# Patient Record
Sex: Female | Born: 1935 | Race: White | Hispanic: No | Marital: Married | Smoking: Former smoker
Health system: Southern US, Community
[De-identification: ages and names within clinical notes are randomized; demographics above are authoritative.]

## PROBLEM LIST (undated history)

## (undated) DIAGNOSIS — I495 Sick sinus syndrome: Secondary | ICD-10-CM

## (undated) DIAGNOSIS — I10 Essential (primary) hypertension: Secondary | ICD-10-CM

## (undated) DIAGNOSIS — I4891 Unspecified atrial fibrillation: Secondary | ICD-10-CM

## (undated) DIAGNOSIS — N289 Disorder of kidney and ureter, unspecified: Secondary | ICD-10-CM

## (undated) DIAGNOSIS — I2510 Atherosclerotic heart disease of native coronary artery without angina pectoris: Secondary | ICD-10-CM

## (undated) DIAGNOSIS — E785 Hyperlipidemia, unspecified: Secondary | ICD-10-CM

## (undated) DIAGNOSIS — I679 Cerebrovascular disease, unspecified: Secondary | ICD-10-CM

## (undated) HISTORY — DX: Essential (primary) hypertension: I10

## (undated) HISTORY — PX: CORONARY ARTERY BYPASS GRAFT: SHX141

## (undated) HISTORY — DX: Disorder of kidney and ureter, unspecified: N28.9

## (undated) HISTORY — PX: PACEMAKER INSERTION: SHX728

## (undated) HISTORY — DX: Atherosclerotic heart disease of native coronary artery without angina pectoris: I25.10

## (undated) HISTORY — DX: Hyperlipidemia, unspecified: E78.5

## (undated) HISTORY — DX: Cerebrovascular disease, unspecified: I67.9

## (undated) HISTORY — DX: Unspecified atrial fibrillation: I48.91

## (undated) HISTORY — DX: Sick sinus syndrome: I49.5

---

## 1997-09-25 ENCOUNTER — Emergency Department (HOSPITAL_COMMUNITY): Admission: EM | Admit: 1997-09-25 | Discharge: 1997-09-25 | Payer: Self-pay | Admitting: Internal Medicine

## 1999-04-18 ENCOUNTER — Encounter: Payer: Self-pay | Admitting: Emergency Medicine

## 1999-04-18 ENCOUNTER — Emergency Department (HOSPITAL_COMMUNITY): Admission: EM | Admit: 1999-04-18 | Discharge: 1999-04-18 | Payer: Self-pay | Admitting: Emergency Medicine

## 1999-04-21 ENCOUNTER — Inpatient Hospital Stay (HOSPITAL_COMMUNITY): Admission: EM | Admit: 1999-04-21 | Discharge: 1999-04-23 | Payer: Self-pay | Admitting: Emergency Medicine

## 2001-11-30 ENCOUNTER — Encounter: Admission: RE | Admit: 2001-11-30 | Discharge: 2001-11-30 | Payer: Self-pay | Admitting: Endocrinology

## 2001-11-30 ENCOUNTER — Encounter: Payer: Self-pay | Admitting: Endocrinology

## 2006-06-08 ENCOUNTER — Ambulatory Visit: Payer: Self-pay | Admitting: Cardiology

## 2006-11-07 ENCOUNTER — Ambulatory Visit: Payer: Self-pay | Admitting: Cardiology

## 2006-11-07 LAB — CONVERTED CEMR LAB
ALT: 15 units/L (ref 0–35)
AST: 29 units/L (ref 0–37)
Albumin: 3.1 g/dL — ABNORMAL LOW (ref 3.5–5.2)
Alkaline Phosphatase: 62 units/L (ref 39–117)
BUN: 46 mg/dL — ABNORMAL HIGH (ref 6–23)
Bilirubin, Direct: 0.1 mg/dL (ref 0.0–0.3)
CO2: 26 meq/L (ref 19–32)
Calcium: 9 mg/dL (ref 8.4–10.5)
Chloride: 110 meq/L (ref 96–112)
Cholesterol: 149 mg/dL (ref 0–200)
Creatinine, Ser: 1.5 mg/dL — ABNORMAL HIGH (ref 0.4–1.2)
GFR calc Af Amer: 43 mL/min
GFR calc non Af Amer: 35 mL/min
Glucose, Bld: 117 mg/dL — ABNORMAL HIGH (ref 70–99)
HDL: 46.9 mg/dL (ref >39.0)
LDL Cholesterol: 87 mg/dL (ref 0–99)
Potassium: 4.1 meq/L (ref 3.5–5.1)
Pro B Natriuretic peptide (BNP): 96 pg/mL (ref 0.0–100.0)
Sodium: 144 meq/L (ref 135–145)
Total Bilirubin: 0.6 mg/dL (ref 0.3–1.2)
Total CHOL/HDL Ratio: 3.2
Total Protein: 6.5 g/dL (ref 6.0–8.3)
Triglycerides: 74 mg/dL (ref 0–149)
VLDL: 15 mg/dL (ref 0–40)

## 2006-11-24 ENCOUNTER — Ambulatory Visit: Payer: Self-pay

## 2006-12-01 ENCOUNTER — Ambulatory Visit: Payer: Self-pay | Admitting: Cardiology

## 2007-10-18 ENCOUNTER — Ambulatory Visit: Payer: Self-pay | Admitting: Cardiology

## 2007-11-10 ENCOUNTER — Ambulatory Visit: Payer: Self-pay

## 2007-11-22 ENCOUNTER — Ambulatory Visit: Payer: Self-pay

## 2008-07-31 ENCOUNTER — Encounter (INDEPENDENT_AMBULATORY_CARE_PROVIDER_SITE_OTHER): Payer: Self-pay | Admitting: *Deleted

## 2008-10-08 DIAGNOSIS — E785 Hyperlipidemia, unspecified: Secondary | ICD-10-CM

## 2008-10-08 DIAGNOSIS — I10 Essential (primary) hypertension: Secondary | ICD-10-CM | POA: Insufficient documentation

## 2008-10-08 DIAGNOSIS — R0602 Shortness of breath: Secondary | ICD-10-CM

## 2008-10-08 DIAGNOSIS — N259 Disorder resulting from impaired renal tubular function, unspecified: Secondary | ICD-10-CM | POA: Insufficient documentation

## 2008-10-08 DIAGNOSIS — I679 Cerebrovascular disease, unspecified: Secondary | ICD-10-CM

## 2008-10-08 DIAGNOSIS — I2510 Atherosclerotic heart disease of native coronary artery without angina pectoris: Secondary | ICD-10-CM | POA: Insufficient documentation

## 2008-10-09 ENCOUNTER — Encounter: Payer: Self-pay | Admitting: Cardiology

## 2008-10-09 ENCOUNTER — Ambulatory Visit: Payer: Self-pay | Admitting: Cardiology

## 2008-10-09 ENCOUNTER — Encounter (INDEPENDENT_AMBULATORY_CARE_PROVIDER_SITE_OTHER): Payer: Self-pay | Admitting: *Deleted

## 2008-12-18 ENCOUNTER — Ambulatory Visit: Payer: Self-pay

## 2008-12-18 ENCOUNTER — Encounter: Payer: Self-pay | Admitting: Cardiology

## 2009-06-11 ENCOUNTER — Encounter: Payer: Self-pay | Admitting: Cardiology

## 2009-06-13 ENCOUNTER — Encounter: Payer: Self-pay | Admitting: Cardiology

## 2009-06-13 ENCOUNTER — Emergency Department (HOSPITAL_COMMUNITY): Admission: EM | Admit: 2009-06-13 | Discharge: 2009-06-13 | Payer: Self-pay | Admitting: Emergency Medicine

## 2009-06-16 ENCOUNTER — Ambulatory Visit: Payer: Self-pay | Admitting: Cardiology

## 2009-06-16 DIAGNOSIS — I4891 Unspecified atrial fibrillation: Secondary | ICD-10-CM

## 2009-06-16 DIAGNOSIS — I4892 Unspecified atrial flutter: Secondary | ICD-10-CM

## 2009-06-17 ENCOUNTER — Telehealth (INDEPENDENT_AMBULATORY_CARE_PROVIDER_SITE_OTHER): Payer: Self-pay

## 2009-06-18 ENCOUNTER — Encounter: Payer: Self-pay | Admitting: Cardiology

## 2009-06-18 ENCOUNTER — Ambulatory Visit: Payer: Self-pay

## 2009-06-18 ENCOUNTER — Ambulatory Visit (HOSPITAL_COMMUNITY): Admission: RE | Admit: 2009-06-18 | Discharge: 2009-06-18 | Payer: Self-pay | Admitting: Cardiology

## 2009-06-18 ENCOUNTER — Ambulatory Visit: Payer: Self-pay | Admitting: Cardiology

## 2009-06-19 ENCOUNTER — Ambulatory Visit: Payer: Self-pay | Admitting: Cardiology

## 2009-06-20 ENCOUNTER — Telehealth: Payer: Self-pay | Admitting: Cardiology

## 2009-06-21 ENCOUNTER — Telehealth: Payer: Self-pay | Admitting: Nurse Practitioner

## 2009-06-23 ENCOUNTER — Inpatient Hospital Stay (HOSPITAL_COMMUNITY): Admission: AD | Admit: 2009-06-23 | Discharge: 2009-06-25 | Payer: Self-pay | Admitting: Cardiology

## 2009-06-23 ENCOUNTER — Encounter: Payer: Self-pay | Admitting: Cardiology

## 2009-06-23 ENCOUNTER — Ambulatory Visit: Payer: Self-pay | Admitting: Cardiology

## 2009-06-24 ENCOUNTER — Encounter: Payer: Self-pay | Admitting: Internal Medicine

## 2009-06-27 ENCOUNTER — Encounter: Payer: Self-pay | Admitting: Internal Medicine

## 2009-07-01 ENCOUNTER — Encounter: Payer: Self-pay | Admitting: Cardiology

## 2009-07-02 ENCOUNTER — Ambulatory Visit: Payer: Self-pay | Admitting: Cardiology

## 2009-07-02 ENCOUNTER — Encounter: Payer: Self-pay | Admitting: Cardiology

## 2009-07-02 DIAGNOSIS — Z950 Presence of cardiac pacemaker: Secondary | ICD-10-CM

## 2009-07-09 ENCOUNTER — Encounter: Payer: Self-pay | Admitting: Internal Medicine

## 2009-07-09 ENCOUNTER — Ambulatory Visit: Payer: Self-pay

## 2009-07-09 ENCOUNTER — Ambulatory Visit: Payer: Self-pay | Admitting: Cardiology

## 2009-07-09 LAB — CONVERTED CEMR LAB: POC INR: 3.9

## 2009-07-16 ENCOUNTER — Ambulatory Visit: Payer: Self-pay | Admitting: Cardiology

## 2009-07-16 LAB — CONVERTED CEMR LAB: POC INR: 4

## 2009-07-21 ENCOUNTER — Telehealth: Payer: Self-pay | Admitting: Cardiology

## 2009-07-23 ENCOUNTER — Ambulatory Visit: Payer: Self-pay | Admitting: Internal Medicine

## 2009-07-30 ENCOUNTER — Ambulatory Visit: Payer: Self-pay

## 2009-07-30 LAB — CONVERTED CEMR LAB: POC INR: 2.2

## 2009-08-13 ENCOUNTER — Ambulatory Visit: Payer: Self-pay | Admitting: Internal Medicine

## 2009-08-13 LAB — CONVERTED CEMR LAB: POC INR: 2.2

## 2009-09-10 ENCOUNTER — Ambulatory Visit: Payer: Self-pay | Admitting: Cardiology

## 2009-09-24 ENCOUNTER — Ambulatory Visit: Payer: Self-pay | Admitting: Cardiovascular Disease

## 2009-09-24 ENCOUNTER — Ambulatory Visit: Payer: Self-pay | Admitting: Internal Medicine

## 2009-09-24 LAB — CONVERTED CEMR LAB: POC INR: 2.1

## 2009-10-21 ENCOUNTER — Ambulatory Visit: Payer: Self-pay | Admitting: Cardiology

## 2009-10-21 ENCOUNTER — Ambulatory Visit: Payer: Self-pay | Admitting: Cardiovascular Disease

## 2009-10-21 LAB — CONVERTED CEMR LAB: POC INR: 1.9

## 2009-11-08 ENCOUNTER — Encounter: Payer: Self-pay | Admitting: Cardiology

## 2009-11-18 ENCOUNTER — Ambulatory Visit: Payer: Self-pay | Admitting: Cardiology

## 2010-01-07 ENCOUNTER — Telehealth (INDEPENDENT_AMBULATORY_CARE_PROVIDER_SITE_OTHER): Payer: Self-pay | Admitting: *Deleted

## 2010-01-07 ENCOUNTER — Ambulatory Visit: Payer: Self-pay | Admitting: Internal Medicine

## 2010-01-07 ENCOUNTER — Ambulatory Visit: Payer: Self-pay

## 2010-01-07 ENCOUNTER — Encounter: Payer: Self-pay | Admitting: Cardiology

## 2010-02-04 ENCOUNTER — Ambulatory Visit: Payer: Self-pay | Admitting: Cardiology

## 2010-02-04 LAB — CONVERTED CEMR LAB: POC INR: 2.6

## 2010-02-05 ENCOUNTER — Ambulatory Visit: Admit: 2010-02-05 | Payer: Self-pay

## 2010-03-09 ENCOUNTER — Ambulatory Visit
Admission: RE | Admit: 2010-03-09 | Discharge: 2010-03-09 | Payer: Self-pay | Source: Home / Self Care | Attending: Internal Medicine | Admitting: Internal Medicine

## 2010-03-09 ENCOUNTER — Ambulatory Visit: Admission: RE | Admit: 2010-03-09 | Discharge: 2010-03-09 | Payer: Self-pay | Source: Home / Self Care

## 2010-03-09 LAB — CONVERTED CEMR LAB: POC INR: 2.2

## 2010-03-11 DIAGNOSIS — Z7901 Long term (current) use of anticoagulants: Secondary | ICD-10-CM | POA: Insufficient documentation

## 2010-03-11 DIAGNOSIS — I4891 Unspecified atrial fibrillation: Secondary | ICD-10-CM

## 2010-03-11 DIAGNOSIS — I679 Cerebrovascular disease, unspecified: Secondary | ICD-10-CM

## 2010-03-11 DIAGNOSIS — I4892 Unspecified atrial flutter: Secondary | ICD-10-CM

## 2010-03-12 NOTE — Medication Information (Signed)
Summary: rov/sp  Anticoagulant Therapy  Managed by: Bethena Midget, RN, BSN Referring MD: Olga Millers PCP: Dr. Areta Haber MD: Johney Frame MD, Fayrene Fearing Indication 1: Atrial Fibrillation Lab Used: [facility] [place] Site: [address] INR POC 2.2 INR RANGE 2-3  Dietary changes: no    Health status changes: no    Bleeding/hemorrhagic complications: no    Recent/future hospitalizations: no    Any changes in medication regimen? yes       Details: Took Atonel on 08/08/09  Recent/future dental: no  Any missed doses?: no       Is patient compliant with meds? yes       Allergies: 1)  ! Penicillin 2)  ! Sulfa 3)  ! Steroids 4)  ! Codeine 5)  ! Cephalosporins 6)  ! Paxil 7)  ! Tetracycline 8)  ! Atracurium 9)  ! Macrobid  Anticoagulation Management History:      The patient is taking warfarin and comes in today for a routine follow up visit.  Positive risk factors for bleeding include an age of 75 years or older.  The bleeding index is 'intermediate risk'.  Positive CHADS2 values include History of HTN and Age > 35 years old.  Anticoagulation responsible provider: Aragorn Recker MD, Fayrene Fearing.  INR POC: 2.2.  Exp: 09/2010.    Anticoagulation Management Assessment/Plan:      The patient's current anticoagulation dose is Warfarin sodium 2.5 mg tabs: Use as directed by Anticoagualtion Clinic, Warfarin sodium 1 mg tabs: Use as directed by Anticoagualtion Clinic, Coumadin 1 mg tabs: Take as directed by coumadin clinic..  The target INR is 2.0-3.0.  The next INR is due 09/10/2009.  Anticoagulation instructions were given to patient/daughter.  Results were reviewed/authorized by Bethena Midget, RN, BSN.  She was notified by Bethena Midget, RN, BSN.         Prior Anticoagulation Instructions: INR 2.2  Continue same dose of 1 tablet every day except 2 tablets on Monday and Friday.   Current Anticoagulation Instructions: INR 2.2 Continue 1mg s everyday except 2mg s on Mondays and  Fridays. Recheck in 4 weeks.  Prescriptions: COUMADIN 1 MG TABS (WARFARIN SODIUM) Take as directed by coumadin clinic.  #40 x 3   Entered by:   Bethena Midget, RN, BSN   Authorized by:   Hillis Range, MD   Signed by:   Bethena Midget, RN, BSN on 08/13/2009   Method used:   Print then Give to Patient   RxID:   [id number] WARFARIN SODIUM 1 MG TABS (WARFARIN SODIUM) Use as directed by Anticoagualtion Clinic  #40 x 3   Entered by:   Bethena Midget, RN, BSN   Authorized by:   Hillis Range, MD   Signed by:   Bethena Midget, RN, BSN on 08/13/2009   Method used:   Print then Give to Patient   RxID:   [id number] COUMADIN 1 MG TABS (WARFARIN SODIUM) Take as directed by coumadin clinic.  #40 x 3   Entered by:   Bethena Midget, RN, BSN   Authorized by:   Ferman Hamming, MD, [facility]   Signed by:   Bethena Midget, RN, BSN on 08/13/2009   Method used:   Print then Give to Patient   RxID:   [id number] WARFARIN SODIUM 1 MG TABS (WARFARIN SODIUM) Use as directed by Anticoagualtion Clinic  #40 x 3   Entered by:   Bethena Midget, RN, BSN   Authorized by:   Ferman Hamming, MD, [facility]   Signed  by:   Bethena Midget, RN, BSN on 08/13/2009   Method used:   Print then Give to Patient   RxID:   [id number]

## 2010-03-12 NOTE — Medication Information (Signed)
Summary: rov/cb  Anticoagulant Therapy  Managed by: Elaina Pattee, PharmD Referring MD: Olga Millers PCP: Dr. Areta Haber MD: Juanda Chance MD, Tyri Elmore Indication 1: Atrial Fibrillation [place] Site: [address] INR POC 4.0 INR RANGE 2-3  Dietary changes: no    Health status changes: no    Bleeding/hemorrhagic complications: no          Allergies: 1)  ! Penicillin 2)  ! Sulfa 3)  ! Steroids 4)  ! Codeine 5)  ! Cephalosporins 6)  ! Paxil 7)  ! Tetracycline 8)  ! Atracurium 9)  ! Macrobid  Anticoagulation Management History:      The patient is taking warfarin and comes in today for a routine follow up visit.  Positive risk factors for bleeding include an age of 35 years or older.  The bleeding index is 'intermediate risk'.  Positive CHADS2 values include History of HTN and Age > 32 years old.  Anticoagulation responsible provider: Juanda Chance MD, Smitty Cords.  INR POC: 4.0.  Cuvette Lot#: [id number].  Exp: 09/2010.    Anticoagulation Management Assessment/Plan:      The patient's current anticoagulation dose is Warfarin sodium 2.5 mg tabs: Use as directed by Anticoagualtion Clinic.  The next INR is due 07/23/2009.  Results were reviewed/authorized by Elaina Pattee, PharmD.  She was notified by Elaina Pattee, PharmD.         Prior Anticoagulation Instructions: INR 3.9. Hold Coumadin today and tomorrow, then take 0.5 tablet daily except 1 tablet on Mon and Fri.  Recheck in 1 week.  Current Anticoagulation Instructions: INR 4.0. Hold Coumadin today and tomorrow, then start taking 1 mg daily except 2 mg on Fridays.  Recheck in 1 week.

## 2010-03-12 NOTE — Progress Notes (Signed)
Summary: Records Request  Faxed OV, EKG & Discharge Summary to [facility] ([phone]). Debby Freiberg  January 07, 2010 12:37 PM

## 2010-03-12 NOTE — Progress Notes (Signed)
Summary: Cardiology Phone Note - A.Fib with post-termination pause  Phone Note Other Incoming   Caller: [facility]/ then outgoing to patient Summary of Call: received a call from [facility] stating that pt. had a run of a. fib followed by a 5.2 sec pause and restoration of sinus rhythm.  i called the patient @ home and she denied feeling any Ss this am.  She asked that I explain things to her dtr who then got on the phone.  I confirmed for her that Dr. Jens Som was suspicious that pt. may be having post-termination pauses, accounting for presyncope, and in fact, she is (though this appears to have been asymptomatic).  I initially advised, after discussion with Dr. Myrtis Ser, that pt. come into ED for admission with probable plan for pacer placement early next week, however pt says she feels fine and isn't even sure that she wants a pacemaker.  That said, I advised that if pt. has any recurrence of either tachypalps or presyncope that they should come into the ER for eval and admission, as tachypalps are likely to beget additional pauses when the arrhythmia breaks.  Dtr understands instructions and plans to stay with pt today.  I told her that the office would be in touch on Monday morning, likely to arrange pacer placement if pt was agreeable @ that point.  Pt. and dtr were grateful for callback. Initial call taken by: Creig Hines, ANP-BC,  Jun 21, 2009 10:47 AM     Appended Document: Cardiology Phone Note - A.Fib with post-termination pause Needs EP eval for pacemaker ASAP  Appended Document: Cardiology Phone Note - A.Fib with post-termination pause pt will dr allred this afternoon

## 2010-03-12 NOTE — Miscellaneous (Signed)
Summary: Device preload  Clinical Lists Changes  Observations: Added new observation of PPM INDICATN: Sick sinus syndrome (06/27/2009 15:54) Added new observation of MAGNET RTE: BOL 85 ERI 65 (06/27/2009 15:54) Added new observation of PPMLEADSTAT1: active (06/27/2009 15:54) Added new observation of PPMLEADSER1: [id number] (06/27/2009 15:54) Added new observation of PPMLEADMOD1: 5092  (06/27/2009 15:54) Added new observation of PPMLEADDOI1: 06/24/2009  (06/27/2009 15:54) Added new observation of PPMLEADLOC1: RV  (06/27/2009 15:54) Added new observation of PPM IMP MD: Hillis Range, MD  (06/27/2009 15:54) Added new observation of PPM DOI: 06/24/2009  (06/27/2009 15:54) Added new observation of PPM SERL#: [id number]  (06/27/2009 15:54) Added new observation of PPM MODL#: [id number]  (06/27/2009 78:29) Added new observation of PACEMAKERMFG: [employer]  (06/27/2009 15:54) Added new observation of PACEMAKER MD: Hillis Range, MD  (06/27/2009 15:54)      PPM Specifications Following MD:  Hillis Range, MD     PPM Vendor:  [employer]     PPM Model Number:  [id number]     PPM Serial Number:  [id number] PPM DOI:  06/24/2009     PPM Implanting MD:  Hillis Range, MD  Lead 1    Location: RV     DOI: 06/24/2009     Model #: [id number]     Serial #: [id number]     Status: active  Magnet Response Rate:  BOL 85 ERI 65  Indications:  Sick sinus syndrome

## 2010-03-12 NOTE — Medication Information (Signed)
Summary: CCV/NEW PT/DM  Anticoagulant Therapy  Managed by: Elaina Pattee, PharmD Referring MD: Olga Millers PCP: Dr. Areta Haber MD: Riley Kill MD, Maisie Fus Indication 1: Atrial Fibrillation [place] Site: [address] INR POC 3.9 INR RANGE 2-3  Dietary changes: yes       Details: Educated on eating consistent amounts of vitamin K-containing foods.  Health status changes: yes       Details: Educated on notifying of health changes.  Bleeding/hemorrhagic complications: yes       Details: Educated on signs/symptoms of bleeding and management.  Recent/future hospitalizations: yes       Details: Educated on notifying of procedures/hospitalizations.  Any changes in medication regimen? yes       Details: Educated on notifying of medication changes.  Recent/future dental: yes     Details: Educated on management regarding procedures.  Any missed doses?: yes     Details: Educated on importance of not doubling doses or missing doses.  Is patient compliant with meds? no     Details: Educated on compliance.  Comments: Performed comprehensive medication review.  Discussed indication and INR range.  Current Medications (verified): 1)  Dyazide 37.5-25 Mg Caps (Triamterene-Hctz) .... Take 1 Capsule By Mouth Once A Day 2)  Procardia Xl 30 Mg Xr24h-Tab (Nifedipine) .... Take 1 Tablet By Mouth Once A Day 3)  Lipitor 80 Mg Tabs (Atorvastatin Calcium) .... Take One Tablet By Mouth Daily. 4)  Actonel 35 Mg Tabs (Risedronate Sodium) .... Monthly 5)  Colace 100 Mg Caps (Docusate Sodium) .Marland Kitchen.. 1 Tab By Mouth Two Times A Day 6)  Levothroid 25 Mcg Tabs (Levothyroxine Sodium) .Marland Kitchen.. 1 Tab By Mouth Once Daily 7)  Multivitamins   Tabs (Multiple Vitamin) .Marland Kitchen.. 1 Tab By Mouth Once Daily 8)  Vit D2 .Marland Kitchen.. 1 Tab By Mouth On Fridays (1.25mg ) 9)  Metoprolol Succinate 25 Mg Xr24h-Tab (Metoprolol Succinate) .... Take One Half  Tablet By Mouth Daily 10)  Icaps  Caps (Multiple Vitamins-Minerals) .... Take 1 Capsule By  Mouth Two Times A Day 11)  Vitamin C 500 Mg  Tabs (Ascorbic Acid) .... Once A Day 12)  Warfarin Sodium 2.5 Mg Tabs (Warfarin Sodium) .... Use As Directed By Anticoagualtion Clinic 13)  Ferrous Sulfate 325 (65 Fe) Mg Tabs (Ferrous Sulfate) .Marland Kitchen.. 1 Tablet By Mouth Daily  Allergies: 1)  ! Penicillin 2)  ! Sulfa 3)  ! Steroids 4)  ! Codeine 5)  ! Cephalosporins 6)  ! Paxil 7)  ! Tetracycline 8)  ! Atracurium 9)  ! Macrobid  Anticoagulation Management History:      The patient comes in today for her initial visit for anticoagulation therapy.  Positive risk factors for bleeding include an age of 75 years or older.  The bleeding index is 'intermediate risk'.  Positive CHADS2 values include History of HTN and Age > 52 years old.  Anticoagulation responsible provider: Riley Kill MD, Maisie Fus.  INR POC: 3.9.  Cuvette Lot#: [id number].  Exp: 09/2010.    Anticoagulation Management Assessment/Plan:      The patient's current anticoagulation dose is Warfarin sodium 2.5 mg tabs: Use as directed by Anticoagualtion Clinic.  The next INR is due 07/16/2009.  Results were reviewed/authorized by Elaina Pattee, PharmD.  She was notified by Elaina Pattee, PharmD.         Current Anticoagulation Instructions: INR 3.9. Hold Coumadin today and tomorrow, then take 0.5 tablet daily except 1 tablet on Mon and Fri.  Recheck in 1 week.

## 2010-03-12 NOTE — Assessment & Plan Note (Signed)
Summary: eph/ c/o afib, seen in ed on friday/ gd   Primary Provider:  Dr. Roxy Manns   History of Present Illness: [name] is a pleasant female who I have seen in the past for coronary artery disease status post coronary artery bypass and graft. She also has cerebrovascular disease.  Her last Myoview on November 10, 2007 showed prior distal anterior infarct and  periinfarct ischemia.  The ejection fraction was 83%. It was unchanged compared to previous. We have treated medically as she has had no symptoms. Last carotid Dopplers were performed in Nov 2010  showed a 40-59% bilateral stenosis  and followup recommended in one year. I last saw her in Nov 2010. Since then she was seen in the emergency room 3 days ago with an episode of atrial fibrillation/flutter converted spontaneously to sinus rhythm. Note the patient has had occasional palpitations for the past month. They're not associated with dyspnea or chest pain. She also has had "dizzy spells". They occur suddenly and resolved within one to 2 seconds. They're not associated with activities or position. They resolve spontaneously. She otherwise denies any dyspnea on exertion or orthopnea but she has had minimal pedal edema. She does not have exertional chest pain.   Current Medications (verified): 1)  Dyazide 37.5-25 Mg Caps (Triamterene-Hctz) .... Take 1 Capsule By Mouth Once A Day 2)  Procardia Xl 30 Mg Xr24h-Tab (Nifedipine) .... Take 1 Tablet By Mouth Once A Day 3)  Lipitor 80 Mg Tabs (Atorvastatin Calcium) .... Take One Tablet By Mouth Daily. 4)  Aspirin Ec 325 Mg Tbec (Aspirin) .... Take One Tablet By Mouth Daily 5)  Persantine 50 Mg Tabs (Dipyridamole) .... Take 1 Tablet By Mouth Three Times A Day 6)  Actonel 35 Mg Tabs (Risedronate Sodium) .... Monthly 7)  Colace 100 Mg Caps (Docusate Sodium) .Marland Kitchen.. 1 Tab By Mouth Two Times A Day 8)  Levothroid 25 Mcg Tabs (Levothyroxine Sodium) .Marland Kitchen.. 1 Tab By Mouth Once Daily 9)  Multivitamins   Tabs  (Multiple Vitamin) .Marland Kitchen.. 1 Tab By Mouth Once Daily 10)  Vit D2 .Marland Kitchen.. 1 Tab By Mouth Once Daily  Allergies: 1)  ! Penicillin 2)  ! Sulfa 3)  ! Steroids 4)  ! Codeine 5)  ! Cephalosporins 6)  ! Paxil 7)  ! Tenormin  Past History:  Past Medical History: CAD (ICD-414.00) HYPERLIPIDEMIA (ICD-272.4) HYPERTENSION (ICD-401.9) CEREBROVASCULAR DISEASE (ICD-437.9) Atrial Flutter/atrial fibrillation RENAL INSUFFICIENCY (ICD-588.9)  Past Surgical History: Reviewed history from 10/09/2008 and no changes required.  status post coronary artery bypass and graft. saphenous vein graft to the diagonal/LAD, saphenous vein graft to circumflex and saphenous vein graft to the right coronary artery.  Social History: Reviewed history from 10/08/2008 and no changes required.  She has a remote history of tobacco use but does not  smoke at present.  She does not consume alcohol.  Review of Systems       Significant unsteadiness with gait but no fevers or chills, productive cough, hemoptysis, dysphasia, odynophagia, melena, hematochezia, dysuria, hematuria, rash, seizure activity, orthopnea, PND,  claudication. Remaining systems are negative.   Vital Signs:  Patient profile:   77 year old female Height:      65 inches Weight:      112 pounds BMI:     18.71 Pulse rate:   65 / minute Resp:     14 per minute BP sitting:   130 / 58  (left arm)  Vitals Entered By: Kem Parkinson (Jun 16, 2009 2:43 PM)  Physical Exam  General:  Well-developed frail in no acute distress.  Skin is warm and dry.  HEENT is normal.  Neck is supple. No thyromegaly. bilateral bruits Chest is clear to auscultation with normal expansion.  Cardiovascular exam is regular rate and rhythm.  Abdominal exam nontender or distended. No masses palpated. Extremities show no edema. neuro grossly intact    EKG  Procedure date:  06/16/2009  Findings:      Sinus rhythm at a rate of 65. Biatrial enlargement. No significant ST  changes.  Impression & Recommendations:  Problem # 1:  ATRIAL FIBRILLATION (ICD-427.31) Patient has both documented atrial fibrillation and flutter. I will schedule an echocardiogram to quantify LV function. Recent TSH was mildly elevated. The patient is having brief dizzy spells. They last one to 2 seconds and I am concerned about the possibility of post termination pauses. I will schedule a CardioNet monitor to more fully evaluate. I am hesitant to add AV nodal blocking agents until we further evaluate this issue. I also had long discussions with the patient and her daughter concerning the risk of embolic events. She has multiple risk factors including age greater than 23, hypertension, diabetes and prior CVA. However she is extremely unsteady on her feet. She is also having near syncopal episodes as described above. For now I will treat with enteric coated aspirin 325 mg p.o. daily. We will await CardioNet monitor results. If she indeed is having post termination pauses then a pacemaker would correct this and we could add Coumadin later. They both understand the higher risk of an embolic event on aspirin. Her updated medication list for this problem includes:    Aspirin Ec 325 Mg Tbec (Aspirin) .Marland Kitchen... Take one tablet by mouth daily    Persantine 50 Mg Tabs (Dipyridamole) .Marland Kitchen... Take 1 tablet by mouth three times a day  Problem # 2:  ATRIAL FLUTTER (ICD-427.32) The patient also has an electrocardiogram demonstrating atrial flutter. I do not think this is worth ablating as she has atrial fibrillation as well. Plan as outlined above. Her updated medication list for this problem includes:    Aspirin Ec 325 Mg Tbec (Aspirin) .Marland Kitchen... Take one tablet by mouth daily    Persantine 50 Mg Tabs (Dipyridamole) .Marland Kitchen... Take 1 tablet by mouth three times a day  Orders: Event (Event)  Problem # 3:  CAD (ICD-414.00) Continue aspirin and statin. Scheduled myoview. Her updated medication list for this problem  includes:    Procardia Xl 30 Mg Xr24h-tab (Nifedipine) .Marland Kitchen... Take 1 tablet by mouth once a day    Aspirin Ec 325 Mg Tbec (Aspirin) .Marland Kitchen... Take one tablet by mouth daily    Persantine 50 Mg Tabs (Dipyridamole) .Marland Kitchen... Take 1 tablet by mouth three times a day  Orders: Nuclear Stress Test (Nuc Stress Test)  Problem # 4:  HYPERLIPIDEMIA (ICD-272.4) Continue statin. Her updated medication list for this problem includes:    Lipitor 80 Mg Tabs (Atorvastatin calcium) .Marland Kitchen... Take one tablet by mouth daily.  Problem # 5:  HYPERTENSION (ICD-401.9) Blood pressure controlled on present medications. Will continue. Her updated medication list for this problem includes:    Dyazide 37.5-25 Mg Caps (Triamterene-hctz) .Marland Kitchen... Take 1 capsule by mouth once a day    Procardia Xl 30 Mg Xr24h-tab (Nifedipine) .Marland Kitchen... Take 1 tablet by mouth once a day    Aspirin Ec 325 Mg Tbec (Aspirin) .Marland Kitchen... Take one tablet by mouth daily  Problem # 6:  CEREBROVASCULAR DISEASE (ICD-437.9) Continue aspirin and statin.  Follow carotid Dopplers November 2011.  Problem # 7:  RENAL INSUFFICIENCY (ICD-588.9) Renal function monitored by primary care.  Other Orders: Echocardiogram (Echo)  Patient Instructions: 1)  Your physician recommends that you schedule a follow-up appointment in: 6 WEEKS 2)  Your physician has requested that you have an echocardiogram.  Echocardiography is a painless test that uses sound waves to create images of your heart. It provides your doctor with information about the size and shape of your heart and how well your heart's chambers and valves are working.  This procedure takes approximately one hour. There are no restrictions for this procedure. 3)  Your physician has recommended that you wear an event monitor.  Event monitors are medical devices that record the heart's electrical activity. Doctors most often use these monitors to diagnose arrhythmias. Arrhythmias are problems with the speed or rhythm of the  heartbeat. The monitor is a small, portable device. You can wear one while you do your normal daily activities. This is usually used to diagnose what is causing palpitations/syncope (passing out). 4)  Your physician has requested that you have an exercise stress myoview.  For further information please visit [email or web address]  Please follow instruction sheet, as given.

## 2010-03-12 NOTE — Cardiovascular Report (Signed)
Summary: Office Visit   Office Visit   Imported By: Roderic Ovens 07/25/2009 11:56:13  _____________________________________________________________________  External Attachment:    Type:   Image     Comment:   External Document

## 2010-03-12 NOTE — Procedures (Signed)
Summary: Summary Report  Summary Report   Imported By: Erle Crocker 07/11/2009 16:33:16  _____________________________________________________________________  External Attachment:    Type:   Image     Comment:   External Document

## 2010-03-12 NOTE — Medication Information (Signed)
Summary: rov/cb  Anticoagulant Therapy  Managed by: Weston Brass, PharmD Referring MD: Olga Millers PCP: Dr. Areta Haber MD: Gala Romney MD, Reuel Boom Indication 1: Atrial Fibrillation [place] Site: [address] INR POC 2.0 INR RANGE 2-3  Dietary changes: no    Health status changes: yes       Details: felt sick over weekend.  Got overheated.  See phone note from earlier this week  Bleeding/hemorrhagic complications: no    Recent/future hospitalizations: no    Any changes in medication regimen? no    Recent/future dental: no  Any missed doses?: no       Is patient compliant with meds? yes       Allergies: 1)  ! Penicillin 2)  ! Sulfa 3)  ! Steroids 4)  ! Codeine 5)  ! Cephalosporins 6)  ! Paxil 7)  ! Tetracycline 8)  ! Atracurium 9)  ! Macrobid  Anticoagulation Management History:      The patient is taking warfarin and comes in today for a routine follow up visit.  Positive risk factors for bleeding include an age of 6 years or older.  The bleeding index is 'intermediate risk'.  Positive CHADS2 values include History of HTN and Age > 46 years old.  Anticoagulation responsible provider: Bensimhon MD, Reuel Boom.  INR POC: 2.0.  Cuvette Lot#: [id number].  Exp: 09/2010.    Anticoagulation Management Assessment/Plan:      The patient's current anticoagulation dose is Warfarin sodium 2.5 mg tabs: Use as directed by Anticoagualtion Clinic.  The target INR is 2.0-3.0.  The next INR is due 07/30/2009.  Anticoagulation instructions were given to patient/daughter.  Results were reviewed/authorized by Weston Brass, PharmD.  She was notified by Weston Brass PharmD.         Prior Anticoagulation Instructions: INR 4.0. Hold Coumadin today and tomorrow, then start taking 1 mg daily except 2 mg on Fridays.  Recheck in 1 week.  Current Anticoagulation Instructions: INR 2.0  Increase dose to 1mg  daily except 2mg  on Monday and Friday.

## 2010-03-12 NOTE — Medication Information (Signed)
Summary: rov/sp  Anticoagulant Therapy  Managed by: Weston Brass, PharmD Referring MD: Olga Millers PCP: Dr. Areta Haber MD: Eden Emms MD, Theron Arista Indication 1: Atrial Fibrillation [place] Site: [address] INR POC 2.2 INR RANGE 2-3  Dietary changes: no    Health status changes: no    Bleeding/hemorrhagic complications: no    Recent/future hospitalizations: no    Any changes in medication regimen? no    Recent/future dental: no  Any missed doses?: no       Is patient compliant with meds? yes       Allergies: 1)  ! Penicillin 2)  ! Sulfa 3)  ! Steroids 4)  ! Codeine 5)  ! Cephalosporins 6)  ! Paxil 7)  ! Tetracycline 8)  ! Atracurium 9)  ! Macrobid  Anticoagulation Management History:      The patient is taking warfarin and comes in today for a routine follow up visit.  Positive risk factors for bleeding include an age of 75 years or older.  The bleeding index is 'intermediate risk'.  Positive CHADS2 values include History of HTN and Age > 34 years old.  Anticoagulation responsible provider: Eden Emms MD, Theron Arista.  INR POC: 2.2.  Exp: 09/2010.    Anticoagulation Management Assessment/Plan:      The patient's current anticoagulation dose is Warfarin sodium 2.5 mg tabs: Use as directed by Anticoagualtion Clinic.  The target INR is 2.0-3.0.  The next INR is due 08/13/2009.  Anticoagulation instructions were given to patient/daughter.  Results were reviewed/authorized by Weston Brass, PharmD.  She was notified by Weston Brass PharmD.         Prior Anticoagulation Instructions: INR 2.0  Increase dose to 1mg  daily except 2mg  on Monday and Friday.   Current Anticoagulation Instructions: INR 2.2  Continue same dose of 1 tablet every day except 2 tablets on Monday and Friday.

## 2010-03-12 NOTE — Medication Information (Signed)
Summary: rov/sp  Anticoagulant Therapy  Managed by: Reina Fuse, PharmD Referring MD: Olga Millers PCP: Dr. Areta Haber MD: Gala Romney MD, Reuel Boom Indication 1: Atrial Fibrillation Lab Used: [facility] [place] Site: [address] INR POC 1.9 INR RANGE 2-3  Dietary changes: no    Health status changes: no    Bleeding/hemorrhagic complications: no    Recent/future hospitalizations: no    Any changes in medication regimen? no    Recent/future dental: no  Any missed doses?: no       Is patient compliant with meds? yes       Allergies: 1)  ! Penicillin 2)  ! Sulfa 3)  ! Steroids 4)  ! Codeine 5)  ! Cephalosporins 6)  ! Paxil 7)  ! Tetracycline 8)  ! Atracurium 9)  ! Macrobid  Anticoagulation Management History:      The patient is taking warfarin and comes in today for a routine follow up visit.  Positive risk factors for bleeding include an age of 75 years or older.  The bleeding index is 'intermediate risk'.  Positive CHADS2 values include History of HTN and Age > 21 years old.  Anticoagulation responsible Solstice Lastinger: Bensimhon MD, Reuel Boom.  INR POC: 1.9.  Cuvette Lot#: [id number].  Exp: 12/2010.    Anticoagulation Management Assessment/Plan:      The patient's current anticoagulation dose is Warfarin sodium 1 mg tabs: Use as directed by Anticoagualtion Clinic.  The target INR is 2.0-3.0.  The next INR is due 02/04/2010.  Anticoagulation instructions were given to patient/daughter.  Results were reviewed/authorized by Reina Fuse, PharmD.  She was notified by Reina Fuse PharmD.         Prior Anticoagulation Instructions: INR 2.3  Continue same dose of 1 tablet every day except 2 tablets on Monday and Friday.  Recheck INR in 4 weeks.   Current Anticoagulation Instructions: INR 1.9  Today, take Coumadin 1.5 tabs (1.5 mg).  Then, continue taking Coumadin 1 tab (1 mg) on all days except for Coumadin 2 tabs (2 mg) on Mon, Fri. Return to clinic in 4  weeks.

## 2010-03-12 NOTE — Medication Information (Signed)
Summary: coumadin f/u appt. with crenshaw at 12:30/sl  Anticoagulant Therapy  Managed by: Weston Brass, PharmD Referring MD: Olga Millers PCP: Dr. Areta Haber MD: Clifton James MD, Cristal Deer Indication 1: Atrial Fibrillation Lab Used: [facility] [place] Site: [address] INR POC 1.9 INR RANGE 2-3  Dietary changes: no    Health status changes: no    Bleeding/hemorrhagic complications: no    Recent/future hospitalizations: no    Any changes in medication regimen? no    Recent/future dental: no  Any missed doses?: no       Is patient compliant with meds? yes       Allergies: 1)  ! Penicillin 2)  ! Sulfa 3)  ! Steroids 4)  ! Codeine 5)  ! Cephalosporins 6)  ! Paxil 7)  ! Tetracycline 8)  ! Atracurium 9)  ! Macrobid  Anticoagulation Management History:      The patient is taking warfarin and comes in today for a routine follow up visit.  Positive risk factors for bleeding include an age of 51 years or older.  The bleeding index is 'intermediate risk'.  Positive CHADS2 values include History of HTN and Age > 36 years old.  Anticoagulation responsible Lamara Brecht: Clifton James MD, Cristal Deer.  INR POC: 1.9.  Exp: 11/2010.    Anticoagulation Management Assessment/Plan:      The patient's current anticoagulation dose is Warfarin sodium 1 mg tabs: Use as directed by Anticoagualtion Clinic.  The target INR is 2.0-3.0.  The next INR is due 11/18/2009.  Anticoagulation instructions were given to patient/daughter.  Results were reviewed/authorized by Weston Brass, PharmD.  She was notified by Weston Brass PharmD.         Prior Anticoagulation Instructions: INR 2.1 Continue 1mg s daily except 2mg s on Mondays and Fridays. Recheck in 4 weeks.   Current Anticoagulation Instructions: INR 1.9  Take 2 tablets today then resume same dose of 1 tablet every day except 2 tablets on Monday and Friday.  Recheck INR in 4 weeks.

## 2010-03-12 NOTE — Progress Notes (Signed)
Summary: over heated  Phone Note Call from Patient Call back at Home Phone [phone] Call back at [phone]   Caller: Daughter/Cindy Summary of Call: got over heated Saturday while riding in the car..... headache started Saturday evening, got sick Sunday am, was able to take meds and ate dinner, did eat this am... this am when she was getting off the toilet she almost passed out, request to speak to nurse asap Initial call taken by: Migdalia Dk,  July 21, 2009 11:43 AM  Follow-up for Phone Call        spoke with dtr cindy, pt got overheated on saturday. she developed a headache and did not feel well. on sunday she vomited, tylenol relieved the headache. today she feels weak, her bp yesterday was 104/59 and when she went to stand she got dizzy. pt instructed to increase fluid intake for the next 24 hours. they will monitor her symptoms and if not feeling better in the am they will call back. Deliah Goody, RN  July 21, 2009 12:27 PM\par

## 2010-03-12 NOTE — Cardiovascular Report (Signed)
Summary: Implant Record  Implant Record   Imported By: Marylou Mccoy 08/26/2009 18:40:10  _____________________________________________________________________  External Attachment:    Type:   Image     Comment:   External Document

## 2010-03-12 NOTE — Progress Notes (Signed)
Summary: Nuc. Pre-Procedure  Phone Note Outgoing Call Call back at [facility] Phone [phone]   Call placed by: Irean Hong, RN,  Jun 17, 2009 11:34 AM Summary of Call: Reviewed information on Myoview Information Sheet (see scanned document for further details).  Spoke with patient and patient's daughter. The patient on persantine three times a day. The patient to hold the persantine 72 hrs prior to Lexiscan(discussed with Dr. Ludwig Clarks nurse), and continue ASA. Preston Weill,RN.

## 2010-03-12 NOTE — Progress Notes (Signed)
Summary: Patients At Home Vitals   Patients At Home Vitals   Imported By: Roderic Ovens 12/24/2009 12:31:33  _____________________________________________________________________  External Attachment:    Type:   Image     Comment:   External Document

## 2010-03-12 NOTE — Medication Information (Signed)
Summary: rov/sp  Anticoagulant Therapy  Managed by: Weston Brass, PharmD Referring MD: Olga Millers PCP: Dr. Areta Haber MD: Juanda Chance MD, Bruce Indication 1: Atrial Fibrillation Lab Used: [facility]  Site: [address] INR POC 2.3 INR RANGE 2-3  Dietary changes: no    Health status changes: no    Bleeding/hemorrhagic complications: no    Recent/future hospitalizations: no    Any changes in medication regimen? no    Recent/future dental: no  Any missed doses?: yes     Details: missed 1 dose last week   Is patient compliant with meds? yes       Allergies: 1)  ! Penicillin 2)  ! Sulfa 3)  ! Steroids 4)  ! Codeine 5)  ! Cephalosporins 6)  ! Paxil 7)  ! Tetracycline 8)  ! Atracurium 9)  ! Macrobid  Anticoagulation Management History:      The patient is taking warfarin and comes in today for a routine follow up visit.  Positive risk factors for bleeding include an age of 75 years or older.  The bleeding index is 'intermediate risk'.  Positive CHADS2 values include History of HTN and Age > 28 years old.  Anticoagulation responsible provider: Juanda Chance MD, Smitty Cords.  INR POC: 2.3.  Cuvette Lot#: [id number].  Exp: 12/2010.    Anticoagulation Management Assessment/Plan:      The patient's current anticoagulation dose is Warfarin sodium 1 mg tabs: Use as directed by Anticoagualtion Clinic.  The target INR is 2.0-3.0.  The next INR is due 12/16/2009.  Anticoagulation instructions were given to patient/daughter.  Results were reviewed/authorized by Weston Brass, PharmD.  She was notified by Weston Brass PharmD.         Prior Anticoagulation Instructions: INR 1.9  Take 2 tablets today then resume same dose of 1 tablet every day except 2 tablets on Monday and Friday.  Recheck INR in 4 weeks.   Current Anticoagulation Instructions: INR 2.3  Continue same dose of 1 tablet every day except 2 tablets on Monday and Friday.  Recheck INR in 4 weeks.

## 2010-03-12 NOTE — Progress Notes (Signed)
Summary: daugther out of town/call other relatives  Phone Note Call from Patient   Caller: Daughter/Sue Anne Hahn Summary of Call: daughter is going out of town, concerned about her mother.... request if anything happens to call Ames Dura @ [phone] or Gerre Scull [phone], they will be the closes relatives Initial call taken by: Migdalia Dk,  Jun 20, 2009 9:34 AM  Follow-up for Phone Call        spoke with cindy,she is aware of test results Deliah Goody, RN  Jun 20, 2009 4:23 PM

## 2010-03-12 NOTE — Assessment & Plan Note (Signed)
Summary: F3M/DM [place] office coumadin appt. at 12/sl   Primary [place]:  Dr. Roxy Manns   History of Present Illness: Jill Houston is a pleasant female who I have seen in the past for coronary artery disease status post coronary artery bypass and graft. She also has cerebrovascular disease.  Her last Myoview on November 10, 2007 showed prior distal anterior infarct and  periinfarct ischemia.  The ejection fraction was 83%. It was unchanged compared to previous. We have treated medically as she has had no symptoms. Last carotid Dopplers were performed in Nov 2010  showed a 40-59% bilateral stenosis  and followup recommended in one year. She has also been found to have paroxysmal atrial fibrillation/flutter. Echocardiogram in May 2011 revealed normal LV function, mild aortic, mitral and tricuspid regurgitation.  I last saw her in May 2011. She was having dizzy spells and a monitor was placed which revealed significant pauses. She therefore had a pacemaker placed. Note there was a single chamber pacemaker. When she was ventricular paced her blood pressure decreased. When I saw her previously in May 2011 she was bradycardic and we decreased her Toprol. Since then the patient denies any dyspnea on exertion, orthopnea, PND, pedal edema, palpitations, syncope or chest pain.   Current Medications (verified): 1)  Dyazide 37.5-25 Mg Caps (Triamterene-Hctz) .... Take 1 Capsule By Mouth Once A Day 2)  Procardia Xl 30 Mg Xr24h-Tab (Nifedipine) .... Take 1 Tablet By Mouth Once A Day 3)  Lipitor 80 Mg Tabs (Atorvastatin Calcium) .... Take One Tablet By Mouth Daily. 4)  Actonel 35 Mg Tabs (Risedronate Sodium) .... Monthly 5)  Colace 100 Mg Caps (Docusate Sodium) .Marland Kitchen.. 1 Tab By Mouth Two Times A Day 6)  Levothroid 25 Mcg Tabs (Levothyroxine Sodium) .Marland Kitchen.. 1 Tab By Mouth Once Daily 7)  Multivitamins   Tabs (Multiple Vitamin) .Marland Kitchen.. 1 Tab By Mouth Once Daily 8)  Vit D2 .Marland Kitchen.. 1 Tab By Mouth On Fridays (1.25mg ) 9)  Icaps   Caps (Multiple Vitamins-Minerals) .... Take 1 Capsule By Mouth Two Times A Day 10)  Vitamin C 500 Mg  Tabs (Ascorbic Acid) .... Once A Day 11)  Warfarin Sodium 1 Mg Tabs (Warfarin Sodium) .... Use As Directed By Anticoagualtion Clinic 12)  Ferrous Sulfate 325 (65 Fe) Mg Tabs (Ferrous Sulfate) .Marland Kitchen.. 1 Tablet By Mouth Daily  Allergies: 1)  ! Penicillin 2)  ! Sulfa 3)  ! Steroids 4)  ! Codeine 5)  ! Cephalosporins 6)  ! Paxil 7)  ! Tetracycline 8)  ! Atracurium 9)  ! Macrobid  Past History:  Past Medical History: Reviewed history from 07/02/2009 and no changes required. CAD (ICD-414.00) HYPERLIPIDEMIA (ICD-272.4) HYPERTENSION (ICD-401.9) CEREBROVASCULAR DISEASE (ICD-437.9) Atrial Flutter/atrial fibrillation RENAL INSUFFICIENCY (ICD-588.9) H/O post termination pauses requiring pacemaker  Past Surgical History: Reviewed history from 10/09/2008 and no changes required.  status post coronary artery bypass and graft. saphenous vein graft to the diagonal/LAD, saphenous vein graft to circumflex and saphenous vein graft to the right coronary artery.  Social History: Reviewed history from 10/08/2008 and no changes required.  She has a remote history of tobacco use but does not  smoke at present.  She does not consume alcohol.  Review of Systems       no fevers or chills, productive cough, hemoptysis, dysphasia, odynophagia, melena, hematochezia, dysuria, hematuria, rash, seizure activity, orthopnea, PND, pedal edema, claudication. Remaining systems are negative.   Vital Signs:  Patient profile:   77 year old female Height:  65 inches Weight:      115 pounds BMI:     19.21 Pulse rate:   67 / minute Resp:     18 per minute BP sitting:   160 / 75  (left arm)  Vitals Entered By: Kem Parkinson (October 21, 2009 12:07 PM)  Physical Exam  General:  Well-developed well-nourished in no acute distress.  Skin is warm and dry.  HEENT is normal.  Neck is supple. No  thyromegaly.  Chest is clear to auscultation with normal expansion.  Cardiovascular exam is regular rate and rhythm.  Abdominal exam nontender or distended. No masses palpated. Extremities show no edema. neuro grossly intact    EKG  Procedure date:  10/21/2009  Findings:      Sus rhythm at a rate of 67. Nonspecific ST changes. Occasional PACs.  PPM Specifications Following MD:  Hillis Range, MD     PPM Vendor:  [employer]     PPM Model Number:  [id number]     PPM Serial Number:  [id number] PPM DOI:  06/24/2009     PPM Implanting MD:  Hillis Range, MD  Lead 1    Location: [facility]     DOI: 06/24/2009     Model #: [id number]     Serial #: [id number]     Status: active  Magnet Response Rate:  BOL 85 ERI 65  Indications:  Sick sinus syndrome   PPM Follow Up Pacer Dependent:  No      Parameters Mode:  VVI     Lower Rate Limit:  40     Impression & Recommendations:  Problem # 1:  ATRIAL FIBRILLATION (ICD-427.31) Continue Coumadin. She is not on any AV nodal blocking agent as this caused significant bradycardia previously. She does have a pacemaker but it is a single chamber device. When she past ventricular pacing for prolonged periods she developed hypotension. Her updated medication list for this problem includes:    Warfarin Sodium 1 Mg Tabs (Warfarin sodium) ..... Use as directed by anticoagualtion clinic  Problem # 2:  PACEMAKER, PERMANENT (ICD-V45.01) Management per electrophysiology.  Problem # 3:  ATRIAL FLUTTER (ICD-427.32)  Her updated medication list for this problem includes:    Warfarin Sodium 1 Mg Tabs (Warfarin sodium) ..... Use as directed by anticoagualtion clinic  Problem # 4:  CAD (ICD-414.00) Continue present medications. Plan repeat Myoview in 6 months when she returns. Her updated medication list for this problem includes:    Procardia Xl 30 Mg Xr24h-tab (Nifedipine) .Marland Kitchen... Take 1 tablet by mouth once a day    Warfarin Sodium 1 Mg Tabs (Warfarin sodium) ..... Use as  directed by anticoagualtion clinic  Problem # 5:  HYPERLIPIDEMIA (ICD-272.4) Continue statin. Lipids and liver monitored by primary care. Her updated medication list for this problem includes:    Lipitor 80 Mg Tabs (Atorvastatin calcium) .Marland Kitchen... Take one tablet by mouth daily.  Problem # 6:  HYPERTENSION (ICD-401.9) Blood pressure mildly elevated. However it typically is normal. Following at home and increase meds as needed. Her updated medication list for this problem includes:    Dyazide 37.5-25 Mg Caps (Triamterene-hctz) .Marland Kitchen... Take 1 capsule by mouth once a day    Procardia Xl 30 Mg Xr24h-tab (Nifedipine) .Marland Kitchen... Take 1 tablet by mouth once a day  Problem # 7:  CEREBROVASCULAR DISEASE (ICD-437.9) Continue statin. All of carotid Dopplers November 2011. She is not on aspirin because she is on Coumadin and felt to be high her bleeding risk. Orders: Carotid Duplex (  Carotid Duplex)  Problem # 8:  RENAL INSUFFICIENCY (ICD-588.9)  Patient Instructions: 1)  Your physician recommends that you schedule a follow-up appointment in: 6 MONTHS 2)  Your physician has requested that you have a carotid duplex. This test is an ultrasound of the carotid arteries in your neck. It looks at blood flow through these arteries that supply the brain with blood. Allow one hour for this exam. There are no restrictions or special instructions.

## 2010-03-12 NOTE — Medication Information (Signed)
Summary: rov/tm  Anticoagulant Therapy  Managed by: Weston Brass, PharmD Referring MD: Olga Millers PCP: Dr. Areta Haber MD: Riley Kill MD, Maisie Fus Indication 1: Atrial Fibrillation Lab Used: [facility]  Site: [address] INR POC 2.4 INR RANGE 2-3  Dietary changes: no    Health status changes: no    Bleeding/hemorrhagic complications: no    Recent/future hospitalizations: no    Any changes in medication regimen? no    Recent/future dental: no  Any missed doses?: no       Is patient compliant with meds? yes       Allergies: 1)  ! Penicillin 2)  ! Sulfa 3)  ! Steroids 4)  ! Codeine 5)  ! Cephalosporins 6)  ! Paxil 7)  ! Tetracycline 8)  ! Atracurium 9)  ! Macrobid  Anticoagulation Management History:      The patient is taking warfarin and comes in today for a routine follow up visit.  Positive risk factors for bleeding include an age of 75 years or older.  The bleeding index is 'intermediate risk'.  Positive CHADS2 values include History of HTN and Age > 74 years old.  Anticoagulation responsible provider: Riley Kill MD, Maisie Fus.  INR POC: 2.4.  Exp: 09/2010.    Anticoagulation Management Assessment/Plan:      The patient's current anticoagulation dose is Warfarin sodium 2.5 mg tabs: Use as directed by Anticoagualtion Clinic, Warfarin sodium 1 mg tabs: Use as directed by Anticoagualtion Clinic, Coumadin 1 mg tabs: Take as directed by coumadin clinic..  The target INR is 2.0-3.0.  The next INR is due 10/08/2009.  Anticoagulation instructions were given to patient/daughter.  Results were reviewed/authorized by Weston Brass, PharmD.  She was notified by Liana Gerold, PharmD Candidate.         Prior Anticoagulation Instructions: INR 2.2 Continue 1mg s everyday except 2mg s on Mondays and Fridays. Recheck in 4 weeks.   Current Anticoagulation Instructions: INR 2.4  Continue with Coumadin 1 tablet (1 mg) daily except 2 tablets (2 mg) Mon and Fri.   Return to clinic in 4 weeks.

## 2010-03-12 NOTE — Procedures (Signed)
Summary: wound check   Current Medications (verified): 1)  Dyazide 37.5-25 Mg Caps (Triamterene-Hctz) .... Take 1 Capsule By Mouth Once A Day 2)  Procardia Xl 30 Mg Xr24h-Tab (Nifedipine) .... Take 1 Tablet By Mouth Once A Day 3)  Lipitor 80 Mg Tabs (Atorvastatin Calcium) .... Take One Tablet By Mouth Daily. 4)  Actonel 35 Mg Tabs (Risedronate Sodium) .... Monthly 5)  Colace 100 Mg Caps (Docusate Sodium) .Marland Kitchen.. 1 Tab By Mouth Two Times A Day 6)  Levothroid 25 Mcg Tabs (Levothyroxine Sodium) .Marland Kitchen.. 1 Tab By Mouth Once Daily 7)  Multivitamins   Tabs (Multiple Vitamin) .Marland Kitchen.. 1 Tab By Mouth Once Daily 8)  Vit D2 .Marland Kitchen.. 1 Tab By Mouth On Fridays (1.25mg ) 9)  Icaps  Caps (Multiple Vitamins-Minerals) .... Take 1 Capsule By Mouth Two Times A Day 10)  Vitamin C 500 Mg  Tabs (Ascorbic Acid) .... Once A Day 11)  Warfarin Sodium 2.5 Mg Tabs (Warfarin Sodium) .... Use As Directed By Anticoagualtion Clinic 12)  Ferrous Sulfate 325 (65 Fe) Mg Tabs (Ferrous Sulfate) .Marland Kitchen.. 1 Tablet By Mouth Daily  Allergies (verified): 1)  ! Penicillin 2)  ! Sulfa 3)  ! Steroids 4)  ! Codeine 5)  ! Cephalosporins 6)  ! Paxil 7)  ! Tetracycline 8)  ! Atracurium 9)  ! Macrobid  PPM Specifications Following MD:  Hillis Range, MD     PPM Vendor:  [employer]     PPM Model Number:  [id number]     PPM Serial Number:  [id number] PPM DOI:  06/24/2009     PPM Implanting MD:  Hillis Range, MD  Lead 1    Location: [facility]     DOI: 06/24/2009     Model #: [id number]     Serial #: [id number]     Status: active  Magnet Response Rate:  BOL 85 ERI 65  Indications:  Sick sinus syndrome   PPM Follow Up Remote Check?  No Battery Voltage:  2.80 V     Battery Est. Longevity:  11.5 YEARS     Pacer Dependent:  No     Right Ventricle  Amplitude: 22.4 mV, Impedance: 614 ohms, Threshold: 0.5 V at 0.4 msec  Episodes Ventricular High Rate:  0     Ventricular Pacing:  23.8%  Parameters Mode:  VVI     Lower Rate Limit:  40     Next Cardiology Appt Due:   08/08/2009 Tech Comments:  Wound check appt.  Steri-strips removed.  Wound without redness or edema.  Patient with significant bradycardia.  Device programmed VVI because of inability to pass atrial lead at implant and indication of tachy-brady syndrome. Pt became hypotensive with ventricular pacing during implant.  All of patient's heartbeats since implant have been less than 70bpm.  D/W Dr Johney Frame, pt to stop Metoprolol to help with bradycardia and ROV in 1 month to reassess heart rates. Pt aware and agrees with plan. Gypsy Balsam RN BSN  July 15, 2009 10:22 AM    Patient Instructions: 1)  Your physician has recommended you make the following change in your medication: Stop Metoprolol. 2)  Your physician recommends that you schedule a follow-up appointment in: 1 month with Dr Johney Frame.

## 2010-03-12 NOTE — Medication Information (Signed)
Summary: coumadin f/u sl  Anticoagulant Therapy  Managed by: Bethena Midget, RN, BSN Referring MD: Olga Millers PCP: Dr. Areta Haber MD: Eden Emms MD, Theron Arista Indication 1: Atrial Fibrillation Lab Used: [facility] [place] Site: [address] INR POC 2.1 INR RANGE 2-3  Dietary changes: no    Health status changes: no    Bleeding/hemorrhagic complications: no    Recent/future hospitalizations: no    Any changes in medication regimen? no    Recent/future dental: no  Any missed doses?: no       Is patient compliant with meds? yes      Comments: Seeing Dr Johney Frame today  Allergies: 1)  ! Penicillin 2)  ! Sulfa 3)  ! Steroids 4)  ! Codeine 5)  ! Cephalosporins 6)  ! Paxil 7)  ! Tetracycline 8)  ! Atracurium 9)  ! Macrobid  Anticoagulation Management History:      The patient is taking warfarin and comes in today for a routine follow up visit.  Positive risk factors for bleeding include an age of 72 years or older.  The bleeding index is 'intermediate risk'.  Positive CHADS2 values include History of HTN and Age > 52 years old.  Anticoagulation responsible provider: Eden Emms MD, Theron Arista.  INR POC: 2.1.  Cuvette Lot#: [id number].  Exp: 11/2010.    Anticoagulation Management Assessment/Plan:      The patient's current anticoagulation dose is Warfarin sodium 2.5 mg tabs: Use as directed by Anticoagualtion Clinic, Warfarin sodium 1 mg tabs: Use as directed by Anticoagualtion Clinic, Coumadin 1 mg tabs: Take as directed by coumadin clinic..  The target INR is 2.0-3.0.  The next INR is due 10/21/2009.  Anticoagulation instructions were given to patient/daughter.  Results were reviewed/authorized by Bethena Midget, RN, BSN.  She was notified by Bethena Midget, RN, BSN.         Prior Anticoagulation Instructions: INR 2.4  Continue with Coumadin 1 tablet (1 mg) daily except 2 tablets (2 mg) Mon and Fri.  Return to clinic in 4 weeks.  Current Anticoagulation  Instructions: INR 2.1 Continue 1mg s daily except 2mg s on Mondays and Fridays. Recheck in 4 weeks.

## 2010-03-12 NOTE — Medication Information (Signed)
Summary: rov/sl  Anticoagulant Therapy  Managed by: Bayard Hugger, PharmD Referring MD: Olga Millers PCP: Dr. Areta Haber MD: Riley Kill MD, Maisie Fus Indication 1: Atrial Fibrillation Lab Used: [facility] [place] Site: [address] INR POC 2.6 INR RANGE 2-3  Dietary changes: no    Health status changes: no    Bleeding/hemorrhagic complications: no    Recent/future hospitalizations: no    Any changes in medication regimen? no    Recent/future dental: no  Any missed doses?: no       Is patient compliant with meds? yes       Allergies: 1)  ! Penicillin 2)  ! Sulfa 3)  ! Steroids 4)  ! Codeine 5)  ! Cephalosporins 6)  ! Paxil 7)  ! Tetracycline 8)  ! Atracurium 9)  ! Macrobid  Anticoagulation Management History:      Positive risk factors for bleeding include an age of 19 years or older.  The bleeding index is 'intermediate risk'.  Positive CHADS2 values include History of HTN and Age > 76 years old.  Anticoagulation responsible Annita Ratliff: Riley Kill MD, Maisie Fus.  INR POC: 2.6.  Cuvette Lot#: [id number].  Exp: 03/2011.    Anticoagulation Management Assessment/Plan:      The patient's current anticoagulation dose is Warfarin sodium 1 mg tabs: Use as directed by Anticoagualtion Clinic.  The target INR is 2.0-3.0.  The next INR is due 03/04/2010.  Anticoagulation instructions were given to patient/daughter.  Results were reviewed/authorized by Bayard Hugger, PharmD.         Prior Anticoagulation Instructions: INR 1.9  Today, take Coumadin 1.5 tabs (1.5 mg).  Then, continue taking Coumadin 1 tab (1 mg) on all days except for Coumadin 2 tabs (2 mg) on Mon, Fri. Return to clinic in 4 weeks.   Current Anticoagulation Instructions: INR 2.6 Continue taking 2 (2mg ) tablet on Mondays and Fridays Take 1 tablet (1mg ) the rest of the days Recheck INR in 4 weeks

## 2010-03-12 NOTE — Cardiovascular Report (Signed)
Summary: Outpatient Coinsurance Notice   Outpatient Coinsurance Notice   Imported By: Roderic Ovens 06/23/2009 12:44:53  _____________________________________________________________________  External Attachment:    Type:   Image     Comment:   External Document

## 2010-03-12 NOTE — Assessment & Plan Note (Signed)
Summary: [facility]   Visit Type:  Post-hospital Primary Provider:  Dr. Roxy Manns  CC:  Tiredness.  History of Present Illness: Mrs. Hieronymus is a pleasant female who I have seen in the past for coronary artery disease status post coronary artery bypass and graft. She also has cerebrovascular disease.  Her last Myoview on November 10, 2007 showed prior distal anterior infarct and  periinfarct ischemia.  The ejection fraction was 83%. It was unchanged compared to previous. We have treated medically as she has had no symptoms. Last carotid Dopplers were performed in Nov 2010  showed a 40-59% bilateral stenosis  and followup recommended in one year. She has also been found to have paroxysmal atrial fibrillation/flutter. Echocardiogram in May 2011 revealed normal LV function, mild aortic, mitral and tricuspid regurgitation.  I last saw her in May 2011. She was having dizzy spells and a monitor was placed which revealed significant pauses. She therefore had a pacemaker placed. Since then, she has had some fatigue but denies dyspnea, chest pain, palpitations, syncope or pedal edema.  Current Medications (verified): 1)  Dyazide 37.5-25 Mg Caps (Triamterene-Hctz) .... Take 1 Capsule By Mouth Once A Day 2)  Procardia Xl 30 Mg Xr24h-Tab (Nifedipine) .... Take 1 Tablet By Mouth Once A Day 3)  Lipitor 80 Mg Tabs (Atorvastatin Calcium) .... Take One Tablet By Mouth Daily. 4)  Aspirin Ec 325 Mg Tbec (Aspirin) .... Take One Tablet By Mouth Daily 5)  Persantine 50 Mg Tabs (Dipyridamole) .... Take 1 Tablet By Mouth Three Times A Day 6)  Actonel 35 Mg Tabs (Risedronate Sodium) .... Monthly 7)  Colace 100 Mg Caps (Docusate Sodium) .Marland Kitchen.. 1 Tab By Mouth Two Times A Day 8)  Levothroid 25 Mcg Tabs (Levothyroxine Sodium) .Marland Kitchen.. 1 Tab By Mouth Once Daily 9)  Multivitamins   Tabs (Multiple Vitamin) .Marland Kitchen.. 1 Tab By Mouth Once Daily 10)  Vit D2 .Marland Kitchen.. 1 Tab By Mouth Once Daily 1.25mg  11)  Metoprolol Succinate 25 Mg Xr24h-Tab  (Metoprolol Succinate) .... Take One Tablet By Mouth Daily 12)  Icaps  Caps (Multiple Vitamins-Minerals) .... Take 1 Capsule By Mouth Two Times A Day 13)  Vitamin C 500 Mg  Tabs (Ascorbic Acid) .... Once A Day  Allergies: 1)  ! Penicillin 2)  ! Sulfa 3)  ! Steroids 4)  ! Codeine 5)  ! Cephalosporins 6)  ! Paxil 7)  ! Tenormin  Past History:  Past Medical History: CAD (ICD-414.00) HYPERLIPIDEMIA (ICD-272.4) HYPERTENSION (ICD-401.9) CEREBROVASCULAR DISEASE (ICD-437.9) Atrial Flutter/atrial fibrillation RENAL INSUFFICIENCY (ICD-588.9) H/O post termination pauses requiring pacemaker  Past Surgical History: Reviewed history from 10/09/2008 and no changes required.  status post coronary artery bypass and graft. saphenous vein graft to the diagonal/LAD, saphenous vein graft to circumflex and saphenous vein graft to the right coronary artery.  Social History: Reviewed history from 10/08/2008 and no changes required.  She has a remote history of tobacco use but does not  smoke at present.  She does not consume alcohol.  Review of Systems       Fatigue but no fevers or chills, productive cough, hemoptysis, dysphasia, odynophagia, melena, hematochezia, dysuria, hematuria, rash, seizure activity, orthopnea, PND, pedal edema, claudication. Remaining systems are negative.   Vital Signs:  Patient profile:   77 year old female Height:      65 inches Weight:      114.25 pounds BMI:     19.08 Pulse rate:   45 / minute Pulse rhythm:   regular Resp:  18 per minute BP sitting:   122 / 53  (right arm) Cuff size:   regular  Vitals Entered By: Vikki Ports (Jul 02, 2009 4:13 PM)  Physical Exam  General:  Well-developed well-nourished in no acute distress.  Skin is warm and dry.  HEENT is normal.  Neck is supple. No thyromegaly.  Chest is clear to auscultation with normal expansion. Pacemaker in left upper chest without hematoma or evidence of infection. Cardiovascular exam is  regular rate and rhythm.  Abdominal exam nontender or distended. No masses palpated. Extremities show no edema. neuro grossly intact    PPM Specifications Following MD:  Hillis Range, MD     PPM Vendor:  [employer]     PPM Model Number:  [id number]     PPM Serial Number:  [id number] PPM DOI:  06/24/2009     PPM Implanting MD:  Hillis Range, MD  Lead 1    Location: [facility]     DOI: 06/24/2009     Model #: [id number]     Serial #: [id number]     Status: active  Magnet Response Rate:  BOL 85 ERI 65  Indications:  Sick sinus syndrome   Impression & Recommendations:  Problem # 1:  PACEMAKER, PERMANENT (ICD-V45.01) Management her EP. Note the device is single chamber. Scheduled for device clinic on June 1.   Problem # 2:  ATRIAL FIBRILLATION (ICD-427.31) Patient is in sinus rhythm today. However her heart rate is decreased at 45 and she is complaining of fatigue. I discussed this with Dr. Johney Frame. He was unable to place a dual-chamber device. She did develop hypotension with ventricular pacing alone. However she also has elevated heart rates when she develops atrial fibrillation. I will decrease her Toprol to 12.5 mg p.o. daily to see if this improves her fatigue. Hopefully this will also provide some AV nodal blocking effect for when she develops atrial fibrillation. We had a long discussion today concerning her risk of embolic event. She has multiple embolic risk factors. She does use a walker but does not fall. She is not having any further presyncope after pacemaker has been placed. We have elected to begin Coumadin. I will discontinue her persantine and her aspirin. She will begin Coumadin 2.5 mg p.o. daily on May 27. Her INR will be checked in the Coumadin clinic on June 1 with a goal INR of 2-3. Her updated medication list for this problem includes:    Aspirin Ec 325 Mg Tbec (Aspirin) .Marland Kitchen... Take one tablet by mouth daily    Persantine 50 Mg Tabs (Dipyridamole) .Marland Kitchen... Take 1 tablet by mouth three times a  day    Metoprolol Succinate 25 Mg Xr24h-tab (Metoprolol succinate) .Marland Kitchen... Take one half  tablet by mouth daily    Warfarin Sodium 2.5 Mg Tabs (Warfarin sodium) ..... Use as directed by anticoagualtion clinic  Problem # 3:  ATRIAL FLUTTER (ICD-427.32) As stated previously we will not pursue ablation as she also has documented atrial fibrillation. Her updated medication list for this problem includes:    Aspirin Ec 325 Mg Tbec (Aspirin) .Marland Kitchen... Take one tablet by mouth daily    Persantine 50 Mg Tabs (Dipyridamole) .Marland Kitchen... Take 1 tablet by mouth three times a day    Metoprolol Succinate 25 Mg Xr24h-tab (Metoprolol succinate) .Marland Kitchen... Take one half  tablet by mouth daily    Warfarin Sodium 2.5 Mg Tabs (Warfarin sodium) ..... Use as directed by anticoagualtion clinic  Problem # 4:  CAD (ICD-414.00) Continue present medications but  discontinue aspirin and Persantine given that we are adding Coumadin. I will not treat with both Coumadin and aspirin given her bleeding risk. Plan Myoview in October of 2011. Her updated medication list for this problem includes:    Procardia Xl 30 Mg Xr24h-tab (Nifedipine) .Marland Kitchen... Take 1 tablet by mouth once a day    Aspirin Ec 325 Mg Tbec (Aspirin) .Marland Kitchen... Take one tablet by mouth daily    Persantine 50 Mg Tabs (Dipyridamole) .Marland Kitchen... Take 1 tablet by mouth three times a day    Metoprolol Succinate 25 Mg Xr24h-tab (Metoprolol succinate) .Marland Kitchen... Take one half  tablet by mouth daily    Warfarin Sodium 2.5 Mg Tabs (Warfarin sodium) ..... Use as directed by anticoagualtion clinic  Problem # 5:  HYPERLIPIDEMIA (ICD-272.4) Continue statin. Her updated medication list for this problem includes:    Lipitor 80 Mg Tabs (Atorvastatin calcium) .Marland Kitchen... Take one tablet by mouth daily.  Problem # 6:  HYPERTENSION (ICD-401.9) Blood pressure control on present medications. Will continue. Her updated medication list for this problem includes:    Dyazide 37.5-25 Mg Caps (Triamterene-hctz) .Marland Kitchen... Take  1 capsule by mouth once a day    Procardia Xl 30 Mg Xr24h-tab (Nifedipine) .Marland Kitchen... Take 1 tablet by mouth once a day    Aspirin Ec 325 Mg Tbec (Aspirin) .Marland Kitchen... Take one tablet by mouth daily    Metoprolol Succinate 25 Mg Xr24h-tab (Metoprolol succinate) .Marland Kitchen... Take one half  tablet by mouth daily  Problem # 7:  CEREBROVASCULAR DISEASE (ICD-437.9) Followup carotid Dopplers November 2011. Continue statin.  Problem # 8:  RENAL INSUFFICIENCY (ICD-588.9) We did discuss dabigitran today. However her creatinine clearance was too low and she is therefore not a candidate.  Patient Instructions: 1)  Your physician recommends that you schedule a follow-up appointment in: 3 MONTHS 2)  Your physician has recommended you make the following change in your medication: TAKE THE LAST DOSE OF PERSANTINE AND ASPRIN ON THURSDAY 07-03-09 3)  START WARFARIN SODIUM(COUMADIN) 2.5MG  ONE TABLET ONCE DAILY STARTING FRIDAY 07-04-09 4)  DECREASE METOPROLOL 25MG  TAKE 1/2 TABLET ONCE DAILY 5)  You have been referred to COUMADIN CLINIC 07-09-09 Prescriptions: WARFARIN SODIUM 2.5 MG TABS (WARFARIN SODIUM) Use as directed by Anticoagualtion Clinic  #30 x 12   Entered by:   Deliah Goody, RN   Authorized by:   Ferman Hamming, MD, [facility]   Signed by:   Deliah Goody, RN on 07/02/2009   Method used:   Electronically to        [facility] [phone]* (retail)       [address] [place], [place]  [address]       Ph: [phone]       Fax: [phone]   RxID:   [id number]

## 2010-03-12 NOTE — Miscellaneous (Signed)
Summary: preload update  Clinical Lists Changes  Observations: Added new observation of CARDCATHFIND: Impression The bilateral bypass grafts are patent Stable moderate carotid disease, bilaterally 40-59% biltaera ICA stenoses (11/22/2007 10:46) Added new observation of NUCLEAR NOS: Myocardial perfusion imgaing was performed at rest 45 minutes following the intravenous administration of Myoview  The patient recived IV adenoisne at 140 micrograms/kg/min for 4 minutes. There were no signifcant changes with infusion,2nd AVB type-1 and 2(2:1,3:1) with infusion, and freq. PACs;occ PVCs  Ejection Fraction: 83%  exercise capacity: Adenosine study with no exercise blood pressure response: Normal blood pressure response clinical symptoms: No chest pain or dyspnea ecg impressions: No signifcant ST segment change suggestive of ischemia overall impression: There is ischmiea in the mid-anterior wall and the anterior apex. There is some scar in this area. The findings are the same as the prior study  (11/10/2007 10:39) Added new observation of NUCLEAR NOS: Myocardial perfusion imgaing was performed at rest 45 minutes following thee intravenous adnminstration of Myoview  The patient recived IV adenoisne at 140 micrograms/kg/min for 4 minute. There were no signifcant changes with infusion, + 2nd degree avb with infusion. Pt had rare pacs. Pt had no sypmtoms with infusion  Ejection Fraction:64%  exercise capacity: Adenosine with no exericse blood pressure response: clinical symptoms: ecg impressions: No signifcant ST segment change suggestive of ishcmemia overall impression: Abnormal adenoisne nuclear study with prior distal anterior infarct and mild to moderate peri-infarct ischemia  (11/24/2006 10:45)      Nuclear Study  Procedure date:  11/10/2007  Findings:      Myocardial perfusion imgaing was performed at rest 45 minutes following the intravenous administration of Myoview  The  patient recived IV adenoisne at 140 micrograms/kg/min for 4 minutes. There were no signifcant changes with infusion,2nd AVB type-1 and 2(2:1,3:1) with infusion, and freq. PACs;occ PVCs  Ejection Fraction: 83%  exercise capacity: Adenosine study with no exercise blood pressure response: Normal blood pressure response clinical symptoms: No chest pain or dyspnea ecg impressions: No signifcant ST segment change suggestive of ischemia overall impression: There is ischmiea in the mid-anterior wall and the anterior apex. There is some scar in this area. The findings are the same as the prior study   Nuclear Study  Procedure date:  11/24/2006  Findings:      Myocardial perfusion imgaing was performed at rest 45 minutes following thee intravenous adnminstration of Myoview  The patient recived IV adenoisne at 140 micrograms/kg/min for 4 minute. There were no signifcant changes with infusion, + 2nd degree avb with infusion. Pt had rare pacs. Pt had no sypmtoms with infusion  Ejection Fraction:64%  exercise capacity: Adenosine with no exericse blood pressure response: clinical symptoms: ecg impressions: No signifcant ST segment change suggestive of ishcmemia overall impression: Abnormal adenoisne nuclear study with prior distal anterior infarct and mild to moderate peri-infarct ischemia   Cardiac Cath  Procedure date:  11/22/2007  Findings:      Impression The bilateral bypass grafts are patent Stable moderate carotid disease, bilaterally 40-59% biltaera ICA stenoses

## 2010-03-12 NOTE — Cardiovascular Report (Signed)
Summary: Office Visit   Office Visit   Imported By: Roderic Ovens 09/26/2009 13:47:05  _____________________________________________________________________  External Attachment:    Type:   Image     Comment:   External Document

## 2010-03-12 NOTE — Assessment & Plan Note (Signed)
Summary: pc2   Visit Type:  Follow-up Primary Wanell Lorenzi:  Dr. Roxy Manns   History of Present Illness: The patient presents today for routine electrophysiology followup. She reports doing very well since last being seen in our clinic.  She has had no further presyncope or symptomatic bradycardia.  The patient denies symptoms of palpitations, chest pain, shortness of breath, orthopnea, PND, lower extremity edema, dizziness, presyncope, syncope, or neurologic sequela. The patient is tolerating medications without difficulties and is otherwise without complaint today.   Current Medications (verified): 1)  Dyazide 37.5-25 Mg Caps (Triamterene-Hctz) .... Take 1 Capsule By Mouth Once A Day 2)  Procardia Xl 30 Mg Xr24h-Tab (Nifedipine) .... Take 1 Tablet By Mouth Once A Day 3)  Lipitor 80 Mg Tabs (Atorvastatin Calcium) .... Take One Tablet By Mouth Daily. 4)  Actonel 35 Mg Tabs (Risedronate Sodium) .... Monthly 5)  Colace 100 Mg Caps (Docusate Sodium) .Marland Kitchen.. 1 Tab By Mouth Two Times A Day 6)  Levothroid 25 Mcg Tabs (Levothyroxine Sodium) .Marland Kitchen.. 1 Tab By Mouth Once Daily 7)  Multivitamins   Tabs (Multiple Vitamin) .Marland Kitchen.. 1 Tab By Mouth Once Daily 8)  Vit D2 .Marland Kitchen.. 1 Tab By Mouth On Fridays (1.25mg ) 9)  Icaps  Caps (Multiple Vitamins-Minerals) .... Take 1 Capsule By Mouth Two Times A Day 10)  Vitamin C 500 Mg  Tabs (Ascorbic Acid) .... Once A Day 11)  Warfarin Sodium 1 Mg Tabs (Warfarin Sodium) .... Use As Directed By Anticoagualtion Clinic 12)  Ferrous Sulfate 325 (65 Fe) Mg Tabs (Ferrous Sulfate) .Marland Kitchen.. 1 Tablet By Mouth Daily  Allergies: 1)  ! Penicillin 2)  ! Sulfa 3)  ! Steroids 4)  ! Codeine 5)  ! Cephalosporins 6)  ! Paxil 7)  ! Tetracycline 8)  ! Atracurium 9)  ! Macrobid  Past History:  Past Medical History: Reviewed history from 07/02/2009 and no changes required. CAD (ICD-414.00) HYPERLIPIDEMIA (ICD-272.4) HYPERTENSION (ICD-401.9) CEREBROVASCULAR DISEASE (ICD-437.9) Atrial  Flutter/atrial fibrillation RENAL INSUFFICIENCY (ICD-588.9) H/O post termination pauses requiring pacemaker  Past Surgical History: Reviewed history from 10/09/2008 and no changes required.  status post coronary artery bypass and graft. saphenous vein graft to the diagonal/LAD, saphenous vein graft to circumflex and saphenous vein graft to the right coronary artery.  Social History: Reviewed history from 10/08/2008 and no changes required.  She has a remote history of tobacco use but does not  smoke at present.  She does not consume alcohol.  Review of Systems       All systems are reviewed and negative except as listed in the HPI.   Vital Signs:  Patient profile:   77 year old female Height:      65 inches Weight:      115 pounds BMI:     19.21 Pulse rate:   72 / minute BP sitting:   118 / 76  (right arm)  Vitals Entered By: Laurance Flatten CMA (September 24, 2009 9:24 AM)  Physical Exam  General:  thin and elderly, NAD Head:  normocephalic and atraumatic Eyes:  PERRLA/EOM intact; conjunctiva and lids normal. Mouth:  Teeth, gums and palate normal. Oral mucosa normal. Neck:  Neck supple, no JVD. No masses, thyromegaly or abnormal cervical nodes. Chest Wall:  pacemaker site is well healed Lungs:  Clear bilaterally to auscultation and percussion. Heart:  RRR, no m/r/g Abdomen:  Bowel sounds positive; abdomen soft and non-tender without masses, organomegaly, or hernias noted. No hepatosplenomegaly. Msk:  in a wheel chair today Extremities:  No clubbing  or cyanosis. Neurologic:  Alert and oriented x 3.   PPM Specifications Following MD:  Hillis Range, MD     PPM Vendor:  [employer]     PPM Model Number:  [id number]     PPM Serial Number:  [id number] PPM DOI:  06/24/2009     PPM Implanting MD:  Hillis Range, MD  Lead 1    Location: [facility]     DOI: 06/24/2009     Model #: [id number]     Serial #: [id number]     Status: active  Magnet Response Rate:  BOL 85 ERI 65  Indications:  Sick sinus  syndrome   PPM Follow Up Battery Voltage:  2.79 V     Battery Est. Longevity:  11 YRS     Pacer Dependent:  No     Right Ventricle  Amplitude: 22.40 mV, Impedance: 661 ohms, Threshold: 0.50 V at 0.40 msec  Episodes MS Episodes:  0     Ventricular High Rate:  1013     Ventricular Pacing:  1.1%  Parameters Mode:  VVI     Lower Rate Limit:  40     Next Cardiology Appt Due:  06/09/2010 Tech Comments:  1013 VHR EPISODES--LONGEST WAS 20 MIN 39 SECONDS.  NORMAL DEVICE FUNCTION.  NO CHANGES MADE. ROV MAY 2012 W/JA. Vella Kohler  September 24, 2009 9:59 AM MD Comments:  normal pacemaker function.  No changes today HR are primarily controlled though she does have rare episodes of RVR  Impression & Recommendations:  Problem # 1:  ATRIAL FIBRILLATION (ICD-427.31) stable therapeutic INR Heart rates appear to be predominantly controlled.  She does not well tolerate ventricular pacing.  Her PPM is programmed to minimize V pacing but appears to have resolved her symptomatic post termination pauses. I would not be very aggressive with controlling high ventricular rates unless they become more of a problem.  She is asymptomatic and doing well at this time.  Problem # 2:  PACEMAKER, PERMANENT (ICD-V45.01) normal pacemaker function for tachy/brady syndrome as above  Problem # 3:  HYPERTENSION (ICD-401.9) controlled  Problem # 4:  CEREBROVASCULAR DISEASE (ICD-437.9) continue lifelong anticoagulation with coumadin  Patient Instructions: 1)  Your physician recommends that you schedule a follow-up appointment in: May 2012

## 2010-03-17 ENCOUNTER — Telehealth: Payer: Self-pay | Admitting: Cardiology

## 2010-03-18 NOTE — Assessment & Plan Note (Signed)
Summary: device/saf   Visit Type:  Follow-up Primary Provider:  Dr. Roxy Manns Radiontchenko   History of Present Illness: The patient presents today for routine electrophysiology followup. She reports doing very well since last being seen in our clinic. The patient denies symptoms of palpitations, chest pain, shortness of breath, orthopnea, PND, lower extremity edema, dizziness, presyncope, syncope, or neurologic sequela. The patient is tolerating medications without difficulties and is otherwise without complaint today.   Current Medications (verified): 1)  Dyazide 37.5-25 Mg Caps (Triamterene-Hctz) .... Take 1 Capsule By Mouth Once A Day 2)  Procardia Xl 30 Mg Xr24h-Tab (Nifedipine) .... Take 1 Tablet By Mouth Once A Day 3)  Lipitor 80 Mg Tabs (Atorvastatin Calcium) .... Take One Tablet By Mouth Daily. 4)  Actonel 35 Mg Tabs (Risedronate Sodium) .... Monthly 5)  Colace 100 Mg Caps (Docusate Sodium) .Marland Kitchen.. 1 Tab By Mouth Two Times A Day 6)  Levothroid 25 Mcg Tabs (Levothyroxine Sodium) .Marland Kitchen.. 1 Tab By Mouth Once Daily 7)  Multivitamins   Tabs (Multiple Vitamin) .Marland Kitchen.. 1 Tab By Mouth Once Daily 8)  Vit D2 .Marland Kitchen.. 1 Tab By Mouth On Fridays (1.25mg ) 9)  Icaps  Caps (Multiple Vitamins-Minerals) .... Take 1 Capsule By Mouth Two Times A Day 10)  Vitamin C 500 Mg  Tabs (Ascorbic Acid) .... Once A Day 11)  Warfarin Sodium 1 Mg Tabs (Warfarin Sodium) .... Use As Directed By Anticoagualtion Clinic 12)  Ferrous Sulfate 325 (65 Fe) Mg Tabs (Ferrous Sulfate) .Marland Kitchen.. 1 Tablet By Mouth Daily  Allergies: 1)  ! Penicillin 2)  ! Sulfa 3)  ! Steroids 4)  ! Codeine 5)  ! Cephalosporins 6)  ! Paxil 7)  ! Tetracycline 8)  ! Atracurium 9)  ! Macrobid  Past History:  Past Medical History: Reviewed history from 07/02/2009 and no changes required. CAD (ICD-414.00) HYPERLIPIDEMIA (ICD-272.4) HYPERTENSION (ICD-401.9) CEREBROVASCULAR DISEASE (ICD-437.9) Atrial Flutter/atrial fibrillation RENAL INSUFFICIENCY  (ICD-588.9) H/O post termination pauses requiring pacemaker  Past Surgical History:  status post coronary artery bypass and graft. saphenous vein graft to the diagonal/LAD, saphenous vein graft to circumflex and saphenous vein graft to the right coronary artery. PPM 5/11 by Fawn Kirk  Social History: Reviewed history from 10/08/2008 and no changes required.  She has a remote history of tobacco use but does not  smoke at present.  She does not consume alcohol.  Vital Signs:  Patient profile:   77 year old female Height:      65 inches Weight:      118 pounds BMI:     19.71 Pulse rate:   62 / minute BP sitting:   100 / 70  (left arm)  Vitals Entered By: Laurance Flatten CMA (March 09, 2010 10:03 AM)  Physical Exam  General:  thin elderly female, NAD Head:  normocephalic and atraumatic Eyes:  PERRLA/EOM intact; conjunctiva and lids normal. Mouth:  Teeth, gums and palate normal. Oral mucosa normal. Neck:  supple Chest Wall:  Pacemaker pocket is well healed Lungs:  Clear bilaterally to auscultation and percussion. Heart:  iRRR, no m/r/g Abdomen:  Bowel sounds positive; abdomen soft and non-tender without masses, organomegaly, or hernias noted. No hepatosplenomegaly. Msk:  in a wheelchair today Extremities:  No clubbing or cyanosis. Neurologic:  Alert and oriented x 3.   PPM Specifications Following MD:  Hillis Range, MD     PPM Vendor:  Medtronic     PPM Model Number:  [id number]     PPM Serial Number:  [id number] PPM DOI:  06/24/2009     PPM Implanting MD:  Hillis Range, MD  Lead 1    Location: RV     DOI: 06/24/2009     Model #: [id number]     Serial #: [id number]     Status: active  Magnet Response Rate:  BOL 85 ERI 65  Indications:  Sick sinus syndrome   PPM Follow Up Remote Check?  No Battery Voltage:  2.79 V     Battery Est. Longevity:  10.5 YEARS     Pacer Dependent:  No     Right Ventricle  Amplitude: 11.2 mV, Impedance: 657 ohms, Threshold: 0.75 V at 0.4  msec  Episodes Ventricular High Rate:  1492     Ventricular Pacing:  0.4%  Parameters Mode:  VVI     Lower Rate Limit:  40     Next Cardiology Appt Due:  06/09/2010 Tech Comments:  No parameter changes.  Device function normal.  Chronic A-fib with RVR, 1492 VHR episodes the longest 4:30 minutes.  She is programmed VVI 40 because of intollerance to pacing.  ROV 5/12 with Dr. Johney Frame. MD Comments:  normal pacemaker function V rates are mostly controlled  Impression & Recommendations:  Problem # 1:  ATRIAL FIBRILLATION (ICD-427.31)  stable V rates are mostly controlled continue coumadin  Her updated medication list for this problem includes:    Warfarin Sodium 1 Mg Tabs (Warfarin sodium) ..... Use as directed by anticoagualtion clinic  Problem # 2:  HYPERTENSION (ICD-401.9)  stable no changes today  Her updated medication list for this problem includes:    Dyazide 37.5-25 Mg Caps (Triamterene-hctz) .Marland Kitchen... Take 1 capsule by mouth once a day    Procardia Xl 60 Mg Xr24h-tab (Nifedipine) .Marland Kitchen... Take 1 tablet each morning  Problem # 3:  PACEMAKER, PERMANENT (ICD-V45.01) s/p single chamber PPM for tachy/brady normal pacemaker function as above

## 2010-03-18 NOTE — Medication Information (Signed)
Summary: rov/mwb  Anticoagulant Therapy  Managed by: Louann Sjogren, PharmD Referring MD: Olga Millers PCP: Dr. Areta Haber MD: Antoine Poche MD,Lisette Mancebo Indication 1: Atrial Fibrillation Lab Used: [facility]  Site: [address] INR POC 2.2 INR RANGE 2-3  Dietary changes: no    Health status changes: no    Bleeding/hemorrhagic complications: no    Recent/future hospitalizations: no    Any changes in medication regimen? no    Recent/future dental: no  Any missed doses?: no       Is patient compliant with meds? yes       Current Medications (verified): 1)  Dyazide 37.5-25 Mg Caps (Triamterene-Hctz) .... Take 1 Capsule By Mouth Once A Day 2)  Procardia Xl 60 Mg Xr24h-Tab (Nifedipine) .... Take 1 Tablet Each Morning 3)  Lipitor 80 Mg Tabs (Atorvastatin Calcium) .... Take One Tablet By Mouth Daily. 4)  Actonel 35 Mg Tabs (Risedronate Sodium) .... Monthly 5)  Colace 100 Mg Caps (Docusate Sodium) .Marland Kitchen.. 1 Tab By Mouth Two Times A Day 6)  Levothyroxine Sodium 50 Mcg Tabs (Levothyroxine Sodium) 7)  Multivitamins   Tabs (Multiple Vitamin) .Marland Kitchen.. 1 Tab By Mouth Once Daily 8)  Vit D2 .Marland Kitchen.. 1 Tab By Mouth On Fridays (1.25mg ) 9)  Icaps  Caps (Multiple Vitamins-Minerals) .... Take 1 Capsule By Mouth Two Times A Day 10)  Vitamin C 500 Mg  Tabs (Ascorbic Acid) .... Once A Day 11)  Warfarin Sodium 1 Mg Tabs (Warfarin Sodium) .... Use As Directed By Anticoagualtion Clinic 12)  Ferrous Sulfate 325 (65 Fe) Mg Tabs (Ferrous Sulfate) .Marland Kitchen.. 1 Tablet By Mouth Daily  Allergies (verified): 1)  ! Penicillin 2)  ! Sulfa 3)  ! Steroids 4)  ! Codeine 5)  ! Cephalosporins 6)  ! Paxil 7)  ! Tetracycline 8)  ! Atracurium 9)  ! Macrobid  Anticoagulation Management History:      The patient is taking warfarin and comes in today for a routine follow up visit.  Positive risk factors for bleeding include an age of 50 years or older.  The bleeding index is 'intermediate risk'.   Positive CHADS2 values include History of HTN and Age > 13 years old.  Today's INR is 2.2.  Anticoagulation responsible provider: Mallika Sanmiguel MD,Charlotte Brafford.  INR POC: 2.2.  Cuvette Lot#: [id number].  Exp: 03/2011.    Anticoagulation Management Assessment/Plan:      The patient's current anticoagulation dose is Warfarin sodium 1 mg tabs: Use as directed by Anticoagualtion Clinic.  The target INR is 2.0-3.0.  The next INR is due 04/06/2010.  Anticoagulation instructions were given to patient/daughter.  Results were reviewed/authorized by Louann Sjogren, PharmD.  She was notified by Louann Sjogren PharmD.         Prior Anticoagulation Instructions: INR 2.6 Continue taking 2 (2mg ) tablet on Mondays and Fridays Take 1 tablet (1mg ) the rest of the days Recheck INR in 4 weeks  Current Anticoagulation Instructions: INR 2.2 (goal 2-3)  Continue taking 1 tablet everyday except take 2 tablets on Mondays and Fridays. Recheck INR in 4 weeks.

## 2010-03-26 NOTE — Progress Notes (Signed)
Summary: refill request  Phone Note Refill Request Message from:  Pharmacy on March 17, 2010 12:08 PM  Refills Requested: Medication #1:  WARFARIN SODIUM 1 MG TABS Use as directed by Anticoagualtion Clinic [facility] [address]   Method Requested: Fax to Local Pharmacy Initial call taken by: Glynda Jaeger,  March 17, 2010 12:08 PM    Prescriptions: WARFARIN SODIUM 1 MG TABS (WARFARIN SODIUM) Use as directed by Anticoagualtion Clinic  #40 Tablet x 3   Entered by:   Windell Hummingbird   Authorized by:   Ferman Hamming, MD, [facility]   Signed by:   Windell Hummingbird on 03/18/2010   Method used:   Electronically to        [facility] [phone]* (retail)       [address] [place], [place]  [address]       Ph: [phone]       Fax: [phone]   RxID:   [id number]

## 2010-04-06 ENCOUNTER — Encounter (INDEPENDENT_AMBULATORY_CARE_PROVIDER_SITE_OTHER): Payer: Medicare Other

## 2010-04-06 ENCOUNTER — Encounter: Payer: Self-pay | Admitting: Cardiology

## 2010-04-06 DIAGNOSIS — Z7901 Long term (current) use of anticoagulants: Secondary | ICD-10-CM

## 2010-04-06 DIAGNOSIS — I4891 Unspecified atrial fibrillation: Secondary | ICD-10-CM

## 2010-04-16 NOTE — Medication Information (Signed)
Summary: rov/tm  Anticoagulant Therapy  Managed by: Windell Hummingbird, RN Referring MD: Olga Millers PCP: Dr. Verl Bangs Supervising MD: Jens Som MD, Arlys John Indication 1: Atrial Fibrillation Lab Used: [facility] [place] Site: [address] INR POC 2.2 INR RANGE 2-3  Dietary changes: no    Health status changes: no    Bleeding/hemorrhagic complications: no    Recent/future hospitalizations: no    Any changes in medication regimen? no    Recent/future dental: no  Any missed doses?: no       Is patient compliant with meds? yes       Allergies: 1)  ! Penicillin 2)  ! Sulfa 3)  ! Steroids 4)  ! Codeine 5)  ! Cephalosporins 6)  ! Paxil 7)  ! Tetracycline 8)  ! Atracurium 9)  ! Macrobid  Anticoagulation Management History:      The patient is taking warfarin and comes in today for a routine follow up visit.  Positive risk factors for bleeding include an age of 32 years or older.  The bleeding index is 'intermediate risk'.  Positive CHADS2 values include History of HTN and Age > 85 years old.  Her last INR was 2.2.  Anticoagulation responsible provider: Jens Som MD, Arlys John.  INR POC: 2.2.  Cuvette Lot#: [id number].  Exp: 02/2011.    Anticoagulation Management Assessment/Plan:      The patient's current anticoagulation dose is Warfarin sodium 1 mg tabs: Use as directed by Anticoagualtion Clinic.  The target INR is 2.0-3.0.  The next INR is due 05/04/2010.  Anticoagulation instructions were given to patient/daughter.  Results were reviewed/authorized by Windell Hummingbird, RN.  She was notified by Windell Hummingbird, RN.         Prior Anticoagulation Instructions: INR 2.2 (goal 2-3)  Continue taking 1 tablet everyday except take 2 tablets on Mondays and Fridays. Recheck INR in 4 weeks.  Current Anticoagulation Instructions: INR 2.2 Continue taking 1 tablet every day, except take 2 tablets on Mondays and Fridays. Recheck in 4 weeks.

## 2010-04-23 ENCOUNTER — Encounter: Payer: Self-pay | Admitting: Cardiology

## 2010-04-27 LAB — COMPREHENSIVE METABOLIC PANEL
ALT: 17 U/L (ref 0–35)
Albumin: 2.9 g/dL — ABNORMAL LOW (ref 3.5–5.2)
Alkaline Phosphatase: 40 U/L (ref 39–117)
Potassium: 3.8 mEq/L (ref 3.5–5.1)
Sodium: 142 mEq/L (ref 135–145)
Total Protein: 5.5 g/dL — ABNORMAL LOW (ref 6.0–8.3)

## 2010-04-27 LAB — CBC
Platelets: 175 10*3/uL (ref 150–400)
RDW: 13.7 % (ref 11.5–15.5)

## 2010-04-27 LAB — GLUCOSE, CAPILLARY
Glucose-Capillary: 105 mg/dL — ABNORMAL HIGH (ref 70–99)
Glucose-Capillary: 98 mg/dL (ref 70–99)
Glucose-Capillary: 99 mg/dL (ref 70–99)

## 2010-04-27 LAB — TSH: TSH: 3.883 u[IU]/mL (ref 0.350–4.500)

## 2010-04-28 LAB — URINALYSIS, ROUTINE W REFLEX MICROSCOPIC
Ketones, ur: NEGATIVE mg/dL
Nitrite: NEGATIVE
Protein, ur: NEGATIVE mg/dL
Urobilinogen, UA: 0.2 mg/dL (ref 0.0–1.0)

## 2010-04-28 LAB — COMPREHENSIVE METABOLIC PANEL
AST: 34 U/L (ref 0–37)
BUN: 43 mg/dL — ABNORMAL HIGH (ref 6–23)
CO2: 24 mEq/L (ref 19–32)
Chloride: 110 mEq/L (ref 96–112)
Creatinine, Ser: 1.55 mg/dL — ABNORMAL HIGH (ref 0.4–1.2)
GFR calc non Af Amer: 32 mL/min — ABNORMAL LOW (ref >60)
Total Bilirubin: 0.7 mg/dL (ref 0.3–1.2)

## 2010-04-28 LAB — DIFFERENTIAL
Basophils Absolute: 0 10*3/uL (ref 0.0–0.1)
Basophils Relative: 1 % (ref 0–1)
Eosinophils Relative: 3 % (ref 0–5)
Lymphocytes Relative: 23 % (ref 12–46)

## 2010-04-28 LAB — CBC
HCT: 43.2 % (ref 36.0–46.0)
MCV: 99.1 fL (ref 78.0–100.0)
Platelets: 142 10*3/uL — ABNORMAL LOW (ref 150–400)
RDW: 13.9 % (ref 11.5–15.5)

## 2010-04-28 LAB — TROPONIN I: Troponin I: 0.02 ng/mL (ref 0.00–0.06)

## 2010-05-06 ENCOUNTER — Other Ambulatory Visit: Payer: Self-pay | Admitting: Cardiovascular Disease

## 2010-05-07 ENCOUNTER — Ambulatory Visit: Payer: Medicare Other | Admitting: Cardiology

## 2010-05-07 ENCOUNTER — Encounter: Payer: Medicare Other | Admitting: *Deleted

## 2010-05-07 NOTE — Telephone Encounter (Signed)
[address] °

## 2010-05-11 ENCOUNTER — Ambulatory Visit (INDEPENDENT_AMBULATORY_CARE_PROVIDER_SITE_OTHER): Payer: Medicare Other | Admitting: *Deleted

## 2010-05-11 ENCOUNTER — Telehealth: Payer: Self-pay | Admitting: Cardiology

## 2010-05-11 DIAGNOSIS — I4892 Unspecified atrial flutter: Secondary | ICD-10-CM

## 2010-05-11 DIAGNOSIS — I4891 Unspecified atrial fibrillation: Secondary | ICD-10-CM

## 2010-05-11 DIAGNOSIS — Z7901 Long term (current) use of anticoagulants: Secondary | ICD-10-CM

## 2010-05-11 DIAGNOSIS — I679 Cerebrovascular disease, unspecified: Secondary | ICD-10-CM

## 2010-05-11 NOTE — Patient Instructions (Signed)
INR 3.3  Skip today's dose of Coumadin then resume same dose of 1 tablet every day except 2 tablets on Monday and Friday.  Recheck INR in 2-3 weeks.

## 2010-05-11 NOTE — Telephone Encounter (Signed)
Spoke with pt dtr, she reports pt has been sick all weekend. She c/o weakness, nausea and vomiting. She states the pt will not eat. She has had these episodes before but they usually do not last this long. She has no fever or other symptoms. They will be coming for a coumadin check today and will talk to the nurses in coumadin.

## 2010-05-11 NOTE — Telephone Encounter (Signed)
Pt daughter states her mother has been not feeling well. Pt has been vomiting and headaches. Pt does not want to eat. Pt daughter wants to know what she needs to do.

## 2010-06-01 ENCOUNTER — Ambulatory Visit (INDEPENDENT_AMBULATORY_CARE_PROVIDER_SITE_OTHER): Payer: Medicare Other | Admitting: *Deleted

## 2010-06-01 DIAGNOSIS — I679 Cerebrovascular disease, unspecified: Secondary | ICD-10-CM

## 2010-06-01 DIAGNOSIS — I4891 Unspecified atrial fibrillation: Secondary | ICD-10-CM

## 2010-06-01 DIAGNOSIS — I4892 Unspecified atrial flutter: Secondary | ICD-10-CM

## 2010-06-01 DIAGNOSIS — Z7901 Long term (current) use of anticoagulants: Secondary | ICD-10-CM

## 2010-06-01 LAB — POCT INR: INR: 3.4

## 2010-06-10 ENCOUNTER — Ambulatory Visit (INDEPENDENT_AMBULATORY_CARE_PROVIDER_SITE_OTHER): Payer: Medicare Other | Admitting: Cardiology

## 2010-06-10 ENCOUNTER — Ambulatory Visit (INDEPENDENT_AMBULATORY_CARE_PROVIDER_SITE_OTHER): Payer: Medicare Other | Admitting: *Deleted

## 2010-06-10 ENCOUNTER — Encounter: Payer: Self-pay | Admitting: Cardiology

## 2010-06-10 DIAGNOSIS — I4891 Unspecified atrial fibrillation: Secondary | ICD-10-CM

## 2010-06-10 DIAGNOSIS — I4892 Unspecified atrial flutter: Secondary | ICD-10-CM

## 2010-06-10 DIAGNOSIS — I679 Cerebrovascular disease, unspecified: Secondary | ICD-10-CM

## 2010-06-10 DIAGNOSIS — Z950 Presence of cardiac pacemaker: Secondary | ICD-10-CM

## 2010-06-10 DIAGNOSIS — Z7901 Long term (current) use of anticoagulants: Secondary | ICD-10-CM

## 2010-06-10 DIAGNOSIS — E785 Hyperlipidemia, unspecified: Secondary | ICD-10-CM

## 2010-06-10 DIAGNOSIS — I2510 Atherosclerotic heart disease of native coronary artery without angina pectoris: Secondary | ICD-10-CM

## 2010-06-10 DIAGNOSIS — I10 Essential (primary) hypertension: Secondary | ICD-10-CM

## 2010-06-10 NOTE — Assessment & Plan Note (Signed)
Continue statin. Followup carotid Dopplers November 2012

## 2010-06-10 NOTE — Assessment & Plan Note (Signed)
Continue statin. Lipids and liver monitored by primary care. 

## 2010-06-10 NOTE — Assessment & Plan Note (Signed)
Continue Coumadin. Note when she was on a beta blocker in the past she developed hypotension with ventricular pacing as her device is a single chamber device.

## 2010-06-10 NOTE — Assessment & Plan Note (Signed)
Continue Coumadin and statin. We discussed repeating her Myoview. However she is somewhat frail and has limited mobility. She is not having symptoms. We therefore elected to continue medical therapy.

## 2010-06-10 NOTE — Assessment & Plan Note (Signed)
Blood pressure controlled. Continue present medications. 

## 2010-06-10 NOTE — Patient Instructions (Signed)
Your physician recommends that you schedule a follow-up appointment in: 6 months with Dr. Crenshaw.  

## 2010-06-10 NOTE — Progress Notes (Signed)
HPI: Jill Houston is a pleasant female who I have seen in the past for coronary artery disease status post coronary artery bypass and graft. She also has cerebrovascular disease.  Her last Myoview on November 10, 2007 showed prior distal anterior infarct and  periinfarct ischemia.  The ejection fraction was 83%. It was unchanged compared to previous. We have treated medically as she has had no symptoms. Last carotid Dopplers were performed in Nov 2011  showed a 40-59% bilateral stenosis  and followup recommended in one year. She has also been found to have paroxysmal atrial fibrillation/flutter. Echocardiogram in May 2011 revealed normal LV function, mild aortic, mitral and tricuspid regurgitation. She has had a pacemaker placed for symptomatic bradycardia. Note this was a single chamber pacemaker. When she was ventricular paced her blood pressure decreased. I last saw her in Sept of 2011. Since then, the patient denies any dyspnea on exertion, orthopnea, PND, pedal edema, palpitations, syncope or chest pain. She does have limited mobility.    Current Outpatient Prescriptions  Medication Sig Dispense Refill  . Ascorbic Acid (VITAMIN C) 500 MG tablet Take 500 mg by mouth daily.        Marland Kitchen atorvastatin (LIPITOR) 80 MG tablet take 1 tablet by mouth at bedtime  30 tablet  11  . docusate sodium (COLACE) 100 MG capsule Take 100 mg by mouth 2 (two) times daily.        . ergocalciferol (VITAMIN D2) 50000 UNITS capsule Take 50,000 Units by mouth once a week.        . ferrous sulfate 325 (65 FE) MG tablet Take 325 mg by mouth daily with breakfast.        . levothyroxine (SYNTHROID, LEVOTHROID) 50 MCG tablet Take 50 mcg by mouth daily.        . Multiple Vitamin (MULTIVITAMIN) tablet Take 1 tablet by mouth daily.        . Multiple Vitamins-Minerals (ICAPS MV PO) Take 1 capsule by mouth daily.        Marland Kitchen NIFEdipine (PROCARDIA-XL) 60 MG (OSM) 24 hr tablet Take 60 mg by mouth daily.        . risedronate (ACTONEL) 35 MG  tablet Take 35 mg by mouth every 30 (thirty) days. with water on empty stomach, nothing by mouth or lie down for next 30 minutes.       . triamterene-hydrochlorothiazide (DYAZIDE) 37.5-25 MG per capsule Take 1 capsule by mouth every morning.        . warfarin (COUMADIN) 1 MG tablet Take by mouth as directed.           Past Medical History  Diagnosis Date  . CAD (coronary artery disease)   . HLD (hyperlipidemia)   . HTN (hypertension)   . CVD (cerebrovascular disease)   . Atrial fib/flutter, transient   . Renal insufficiency     Past Surgical History  Procedure Date  . Pacemaker insertion     medtronic  . Coronary artery bypass graft     History   Social History  . Marital Status: Married    Spouse Name: N/A    Number of Children: N/A  . Years of Education: N/A   Occupational History  . Not on file.   Social History Main Topics  . Smoking status: Former Games developer  . Smokeless tobacco: Not on file  . Alcohol Use: No  . Drug Use: Not on file  . Sexually Active: Not on file   Other Topics Concern  . Not  on file   Social History Narrative  . No narrative on file    ROS: Arthritis in Her Hips but no fevers or chills, productive cough, hemoptysis, dysphasia, odynophagia, melena, hematochezia, dysuria, hematuria, rash, seizure activity, orthopnea, PND, pedal edema, claudication. Remaining systems are negative.  Physical Exam: Well-developed frail in no acute distress.  Skin is warm and dry.  HEENT is normal.  Neck is supple. No thyromegaly.  Chest is clear to auscultation with normal expansion. Status post sternotomy Cardiovascular exam is regular rate and rhythm.  Abdominal exam nontender or distended. No masses palpated. Extremities show no edema. neuro grossly intact  ECG Sinus rhythm at a rate of 70. Occasional PACs. RV conduction delay. No ST changes.

## 2010-06-10 NOTE — Assessment & Plan Note (Signed)
Management per electrophysiology. 

## 2010-06-23 NOTE — Assessment & Plan Note (Signed)
[facility]                            CARDIOLOGY OFFICE NOTE   Jill Houston, Jill Houston                       MRN:          [id number]  DATE:11/07/2006                            DOB:          Jun 03, 1922    Jill Houston is an 77 year old female that I recently saw on 06/08/2006  with a history of coronary disease.  We scheduled her to have a Myoview  and carotid Dopplers, but she was unable to come for those studies, as  she had sinusitis.  She does state that for the past several weeks she  has had difficulty sleeping as she has had dyspnea with lying down.  However, she attributes this to her sinusitis.  There is no PND or pedal  edema.  She has not had chest pain or syncope.  Note, she is unable to  ambulate due to longstanding dizziness which has been evaluated  previously.   Her medications at present include:  1. Dyazide 30 mg p.o. daily.  2. Procardia XL 30 mg p.o. daily.  3. Lipitor 40 mg p.o. q. day.  4. Aspirin 325 mg p.o. q. day.  5. Persantine 50 mg p.o. t.i.d.   PHYSICAL EXAMINATION:  Blood pressure 122/63, pulse 68.  She weighs 120  pounds.  HEENT:  Normal.  NECK:  Supple.  There are bilateral carotid bruits.  CHEST:  Clear.  CARDIOVASCULAR:  Regular rate and rhythm.  ABDOMEN:  benign.  EXTREMITIES:  No edema.   Electrocardiogram shows a sinus rhythm at a rate of 68.  There are no  significant ST changes.   DIAGNOSES:  1. Dyspnea - She attributes this to her sinusitis, and I think is most      likely, as she is not volume overloaded on exam.  We will check a      BNP today to further assess.  2. Coronary artery disease - She will continue on her aspirin, statin.      Note, she has had bradycardia with atenolol in the past by report.      We will schedule her for an adenosine Myoview for risk      stratification.  3. Cerebrovascular disease - She will need followup carotid Dopplers.  4. Hypertension - Her blood pressure is  well-controlled on her present      medications.  5. Hyperlipidemia - We will check her lipids and liver today, and      adjust her Lipitor as indicated.  Our goal LDL would be less than      70 given her history of coronary disease.  6. Renal insufficiency - We will check a BMET today as well.   I will see her back in approximately 6 months.     Madolyn Frieze Jens Som, MD, [facility]  Electronically Signed    BSC/MedQ  DD: 11/07/2006  DT: 11/07/2006  Job #: [id number]   cc:   Verl Bangs, MD

## 2010-06-23 NOTE — Assessment & Plan Note (Signed)
[facility]                            CARDIOLOGY OFFICE NOTE   BROOKIE, WAYMENT                       MRN:          [id number]  DATE:12/01/2006                            DOB:          [birth year]    Ms. Delone is an 77 year old female whom I have recently been seeing for  coronary artery disease status post coronary artery bypass graft, as  well as cerebral vascular disease.  We did schedule her to have a  Myoview, which was performed on November 24, 2006.  I did interpret this  study, and felt that there was a distal anterior infarct, and mild to  moderate periinfarct ischemia.  She returns today to review the options  for that.  Note, she also had carotid Dopplers performed on November 24, 2006.  Her bypass grafts were patent, and there was 40% to 59% bilateral  internal carotid artery stenosis, and a followup was recommended in 1  year.  Since I saw her, she denies any dyspnea, exertional chest pain,  or pedal edema.   Her medications include:  1. Diazide 1 daily.  2. Cartia XL 30 mg p.o. daily.  3. Lipitor 80 mg p.o. daily.  4. Actonel.  5. Aspirin.  6. Persantine.   PHYSICAL EXAMINATION:  Shows a blood pressure of 122/57.  Her pulse is  67.  HEENT:  Normal.  NECK:  Supple.  CHEST:  Clear.  CARDIOVASCULAR EXAM:  Reveals a regular rate and rhythm.  ABDOMINAL EXAM:  Benign.  EXTREMITIES:  Show no edema.   DIAGNOSES:  1. Abnormal nuclear study.  I have reviewed the Myoview, and there      appears to be distal anterior/apical ischemia.  However, the      patient is having no symptoms, and I do not feel that it is a high-      risk study.  I have reviewed the options with her including cardiac      catheterization versus continued medical therapy.  She does not      want catheterization, and I think this is appropriate given that      she is not having symptoms.  We will continue with her present      medications.  We will plan to  repeat her Myoview in 1 year.  If she      has worsening chest pain in the future, then we could proceed with      catheterization at that point.  I have explained the risk of      proceeding, as well as not proceeding.  Note, she would be a little      bit higher risk for a catheterization, given her baseline renal      insufficiency.  2. Coronary artery disease status post coronary artery bypass graft.      She will continue on her aspirin and statin.  She is not on a beta      blocker, as she has been bradycardic with this in the past.  3. Cerebral vascular disease.  She will need followup carotid Dopplers  in October 2009.  4. Hypertension.  Her blood pressure is well controlled.  5. Hyperlipidemia.  We recently increased her Lipitor to 80 mg p.o.      daily.  She will need lipids and liver in 6 weeks.  6. Renal insufficiency.  This was stable when we checked it in      September.  I will see her back in 6 months.     Madolyn Frieze Jens Som, MD, [facility]  Electronically Signed    BSC/MedQ  DD: 12/01/2006  DT: 12/01/2006  Job #: [id number]   cc:   Pat Kocher, M.D.

## 2010-06-23 NOTE — Assessment & Plan Note (Signed)
[facility]                            CARDIOLOGY OFFICE NOTE   Jill Houston, Jill Houston                       MRN:          [id number]  DATE:10/18/2007                            DOB:          May 20, 1922    Jill Houston is an 77 year old female who I have seen in the past for  coronary artery disease status post coronary artery bypass and graft.  She also has cerebrovascular disease.  Her last Myoview showed prior  distal anterior infarct and mild-to-moderate periinfarct ischemia.  We  have been treating her medically as she is having no significant  symptoms.  Since I last saw her, she denies any dyspnea, chest pain,  palpitations or syncope and there is no pedal edema.   CURRENT MEDICATIONS:  1. Aspirin 325 mg daily p.o. daily.  2. Lipitor 80 mg p.o. daily.  3. Persantine 50 mg p.o. t.i.d.  4. Actonel.  5. Calcium.   PHYSICAL EXAMINATION:  VITAL SIGNS:  Today shows blood pressure of  150/70.  Her pulse is 62.  She weighs 119 pounds.  HEENT:  Normal.  NECK:  Supple.  CHEST:  Clear.  CARDIOVASCULAR:  Regular rhythm.  ABDOMEN:  No tenderness.  EXTREMITIES:  No edema.   Electrocardiogram shows a sinus rhythm with occasional PAC.  The axis is  normal.  There are no significant ST changes.   DIAGNOSES:  1. Coronary artery disease status post coronary artery bypass and      graft - Jill Houston is doing well from symptomatic standpoint.  Her      previous Myoview did show ischemia, but we have treated this      medically.  We will plan to repeat her Myoview at this point.  If      it continues to not be high risk, then I would favor medical      therapy.  She will continue on her aspirin and her statin.  2. Cerebrovascular disease - she will need followup carotid Dopplers      as well.  She will continue on her aspirin and statin as well.  3. Hypertension - her blood pressure is mildly elevated.  We will      track this and adjust her regimen as  indicated.  4. Hyperlipidemia - she will continue on her statin.  5. History of renal insufficiency - this is apparently being followed      by her primary care physician.   We will see her back in 1 year.     Madolyn Frieze Jens Som, MD, [facility]  Electronically Signed    BSC/MedQ  DD: 10/18/2007  DT: 10/19/2007  Job #: [id number]   cc:   Verl Bangs, MD

## 2010-06-26 NOTE — H&P (Signed)
[place]  Patient:    Jill Houston, Jill Houston                       MRN: [id number] Adm. Date:  04/21/99 Attending:  Jethro Bastos, M.D.                         History and Physical  CHIEF COMPLAINT:  Vomiting and dehydration.  HISTORY OF PRESENT ILLNESS:  Apparently the patient had a typical flu-like illness back in January and was given a Z-Pak and codeine cough syrup.  She was seen in her primary care physicians office on the 9th, four days ago, feeling run down and tired since having the flu.  She had lost her appetite and in effect had lost four pounds.  The physician at that time prescribed Paxil but she only took one dose (a half a pill) on Saturday the 10th.  On that date she developed vomiting and headache.  She was brought to [facility].  A CT scan of the head was done which showed old left frontal brain stroke with no acute changes. Blood work was done which was essentially normal and IV Phenergan x 2 was given.  Her  nausea improved a bit but later on that day and into the next day continued with headache, nausea and vomiting.  Fluids could not stay down.  Yesterday she was  little bit better at first, getting some soup down and a coke down, but then she started having dry heaves and vomiting up water later on in the day.  She was seen in the primary care physicians office this afternoon.  She was felt to be dehydrated and blood work showed that her sodium was 122.  She was sent by her daughter to [facility] for IV fluids and probable admission. She has now been started on IV fluids.  Since her primary care physician is in [place], in that she does not have any primary care physician here on staff, and as my name is up for unassigned patients, she is now being admitted by myself. She tells me now that she feels some better having been receiving some IV fluids. Her headache has resolved  since this morning.  She did have some dry heaves this morning but has kept down two glasses of water today and one can of sliced peaches. She has not had any diarrhea and in fact has not had any bowel movements at all.  ALLERGIES:  She is allergic to KEFLEX, PENICILLIN, STEROIDS, SULFA, and TETRACYCLINE.  CURRENT MEDICATIONS: 1. Darvocet-N 100 as needed for pain (took one last night). 2. Ativan 1 mg t.i.d. p.r.n. anxiety. 3. Cardizem CD 240 mg one q.d. 4. Lasix 40 mg one-half q.d. 5. Persantine 50 mg one t.i.d. 6. K-Dur 10 mEq q.d. 7. Trental 400 mg t.i.d. 8. Mevacor 20 mg in the morning and 40 mg in the evening. 9. Nitrostat p.r.n., though she has not recently required this.  PAST MEDICAL HISTORY:  A history of coronary artery disease and cerebrovascular  disease.  She had a stroke about 10 years ago and had subsequent bilateral carotid endarterectomies done.  She has had a CABG x 4 in 1995.  She had a partial hysterectomy with appendectomy when younger.  She also has problems with recurrent UTIs and hydronephrosis and receives Cipro periodically for this.  Dr. Elige Radon ees her for this.  FAMILY  HISTORY:  Noncontributory.  SOCIAL HISTORY:  Married once for 55 years.  She has three daughters.  She quit  smoking in the early 1990s.  She does not use alcohol.  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  REVIEW OF SYSTEMS:  She had a flu shot and Pneumovax last fall; in fact that was her second Pneumovax.  She wears glasses.  No eye problems such as cataracts or  glaucoma.  She had a mammogram in December.  Last Pap smear within the last year. She is scheduled for a GI referral for routine colon cancer screening on the 30th of this month.  OBJECTIVE:  GENERAL:  She appears alert, mildly toxic.  VITAL SIGNS:  Blood pressure 135/62, pulse 80, respiratory rate 20, temperature  96.6.  SKIN:  Warm and dry.  No rash.  HEENT:  Her TMs  were normal.  Pupils equal and reactive.  Fundi sharp discs. Cranial nerves intact.  Pharynx clear.  Tongue is dry and coated.  NECK:  Reveals soft left carotid bruit but no bruit on the right.  Bilateral carotid endarterectomy surgical scar is noted.  No thyromegaly or adenopathy.  LUNGS:  Clear.  HEART:  Heart sounds regular without murmur, rub or gallop.  She has a sternotomy scar.  ABDOMEN:  No hepatosplenomegaly, masses or tenderness.  PELVIC/RECTAL/BREAST:  Examinations deferred.  EXTREMITIES:  No edema.  An ECG showed normal sinus rhythm, biatrial enlargement, prolonged Q-T but no acute changes.  Her CBC revealed a hemoglobin of 17.0, which is up from 14 a few days ago (this is consistent with dehydration).  Her white count is 8400.  Electrolytes:  Sodium 22, potassium 2.9, chloride 85, CO2 31, glucose is 116, BUN 15, creatinine 1.4, calcium 9.5.  UA was normal with specific gravity of 1.020.  IMPRESSION:  The patient is dehydrated with electrolyte abnormalities, presumably related to her vomiting which may be related to gastroenteritis or perhaps a migraine-type headache.  Her headache seems to be improved and her vomiting also seems to be improved.  She has a history of coronary artery disease and cerebrovascular disease but that seems to be stable.  PLAN:  The patient is to be admitted and started on IV fluids.  Her electrolytes will be monitored.  She will be placed on telemetry because of her electrolyte abnormalities and because of her history of coronary artery disease.  She will e followed by the [facility].  She will have primary care done after discharge with her primary care doctor (Dr. Record) in [place]. DD:  04/21/99 TD:  04/22/99 Job: [id number]

## 2010-06-26 NOTE — Assessment & Plan Note (Signed)
[facility]                            CARDIOLOGY OFFICE NOTE   Jill Houston, Jill Houston                       MRN:          [id number]  DATE:06/08/2006                            DOB:          1922-08-12    Jill Houston is an 77 year old female, who has a history of coronary  disease, status post coronary bypassing graft, hypertension,  hyperlipidemia, who we are asked to evaluate for her coronary disease.  Note the patient has been followed by Dr. Daleen Houston in the past but has not  been seen since 2003.  She is status post coronary bypassing graft in  September 2004.  At that time, she had saphenous vein graft to the  diagonal and LAD sequential, saphenous vein graft to circumflex and  saphenous vein graft to the right coronary artery.  Her most recent  Myoview was performed on February 12, 2002.  At that time, she was found  to have an ejection fraction of 73%.  There was an anterior defect  without an associated wall motion abnormality, and this was felt to be  attenuation artifact, but there was no ischemia.  The patient also has a  history of cerebrovascular disease.  She has had a left carotid to left  subclavian bypass graft and resection of right carotid to left carotid  bypass graft.  The patient was recently seen by her primary care  physician, and her Trental was discontinued.  She was asked to follow up  with [place] for management of her coronary disease and consultation in this  matter.  The patient has not had any dyspnea, chest pain, palpitations,  or syncope.   MEDICATIONS AT PRESENT:  1. Diazide 1 p.o. daily.  2. Fosamax 70 mg weekly.  3. Procardia XL 30 mg p.o. daily.  4. Zetia 10 mg daily.  5. Lipitor 40 mg p.o. daily.  6. __________  7. Aspirin 325 mg p.o. daily.  8. Persantine.   SHE HAS AN ALLERGY TO PENICILLIN, SULFA, STEROIDS, CODEINE,  CEPHALOSPORINS, PAXIL, AND TENORMIN HAS CAUSED BRADYCARDIA IN THE PAST.   SOCIAL HISTORY:  She has  a remote history of tobacco use but does not  smoke at present.  She does not consume alcohol.   FAMILY HISTORY:  Positive for coronary artery disease in her sister.   PAST MEDICAL HISTORY:  1. Significant for hypertension and hyperlipidemia, but there is no      diabetes mellitus.  2. She has a history of cerebrovascular disease as outlined in the      HPI.  3. She also has a history of coronary disease and status post coronary      bypass graft.  4. She also has osteopenia.  5. The patient also has a history of renal insufficiency.  6. She has a history of chronic kidney infections by report.   REVIEW OF SYSTEMS:  She denies any headaches, fevers, or chills.  There  is no productive cough or hemoptysis.  There is no dysphagia,  odynophagia, melena, or hematochezia.  There is no dysuria or hematuria.  There is no rash or  seizure activity.  There is no orthopnea, PND, or  pedal edema.  There is no exertional chest pain.  There is no  claudication noted.  The remaining systems are negative.   PHYSICAL EXAMINATION:  VITAL SIGNS:  The patient's blood pressure is  126/67, and her pulse is 75.  She weighs 113 pounds.  GENERAL:  She is well-developed and somewhat frail.  She is in no acute  distress.  Her skin is warm and dry.  She does not appear to be  depressed, and there is no peripheral clubbing.  BACK:  Normal.  HEENT:  Normal with normal eyelids.  NECK:  Supple with a normal upstroke bilaterally.  She is status post  bilateral carotid endarterectomy, and there are bilateral bruits noted.  There is no jugular venous distention and no thyromegaly noted.  CHEST:  Clear to auscultation, normal expansion.  CARDIOVASCULAR:  Regular rate and rhythm with a normal S1 and S2.  There  are no murmurs, rubs, or gallops noted.  She is status post sternotomy.  ABDOMEN:  Nontender, she had positive bowel sounds, no  hepatosplenomegaly, and no masses appreciated.  There is __________ .   EXTREMITIES:  She has 2+ femoral pulses bilaterally and no bruits.  No  edema that I could palpate and no cords.  She has 1+ dorsalis pedis  pulses bilaterally.  NEUROLOGIC:  Grossly intact.   Her electrocardiogram shows a sinus rhythm at a rate of 75.  The axis is  normal.  There are nonspecific ST changes.   DIAGNOSES:  1. Coronary artery disease status post coronary artery bypass graft.      We will continue with the patient's aspirin as well as her statin.      She is also on Procardia XL.  We will schedule her for a Myoview      for risk stratification.  If it shows normal perfusion, then we      will continue with medical therapy.  2. History of cerebrovascular disease with bilateral carotid bruits.      We will schedule her for repeat carotid Dopplers and treat as      indicated.  3. Hypertension.  Her blood pressure is well controlled on her present      medications.  4. Hyperlipidemia.  Her most recent LDL was greater than 100.  I have      asked her to increase her Lipitor to 80 mg p.o. daily, and we will      check lipids and liver in 6 weeks and adjust with a goal LDL of      less than 70.  5. Renal insufficiency.  Per her primary care physician.  6. We will see her back in approximately 12 months if the above      studies are normal.     Jill Houston. Jill Som, MD, [facility]  Electronically Signed    BSC/MedQ  DD: 06/08/2006  DT: 06/08/2006  Job #: [id number]

## 2010-07-04 ENCOUNTER — Other Ambulatory Visit: Payer: Self-pay | Admitting: Internal Medicine

## 2010-07-11 ENCOUNTER — Other Ambulatory Visit: Payer: Self-pay | Admitting: Internal Medicine

## 2010-07-13 ENCOUNTER — Ambulatory Visit (INDEPENDENT_AMBULATORY_CARE_PROVIDER_SITE_OTHER): Payer: Medicare Other | Admitting: Internal Medicine

## 2010-07-13 ENCOUNTER — Ambulatory Visit (INDEPENDENT_AMBULATORY_CARE_PROVIDER_SITE_OTHER): Payer: Medicare Other | Admitting: *Deleted

## 2010-07-13 ENCOUNTER — Encounter: Payer: Self-pay | Admitting: Internal Medicine

## 2010-07-13 ENCOUNTER — Other Ambulatory Visit: Payer: Self-pay | Admitting: Cardiology

## 2010-07-13 DIAGNOSIS — I679 Cerebrovascular disease, unspecified: Secondary | ICD-10-CM

## 2010-07-13 DIAGNOSIS — Z7901 Long term (current) use of anticoagulants: Secondary | ICD-10-CM

## 2010-07-13 DIAGNOSIS — I4891 Unspecified atrial fibrillation: Secondary | ICD-10-CM

## 2010-07-13 DIAGNOSIS — I495 Sick sinus syndrome: Secondary | ICD-10-CM | POA: Insufficient documentation

## 2010-07-13 DIAGNOSIS — I10 Essential (primary) hypertension: Secondary | ICD-10-CM

## 2010-07-13 DIAGNOSIS — I498 Other specified cardiac arrhythmias: Secondary | ICD-10-CM

## 2010-07-13 DIAGNOSIS — I4892 Unspecified atrial flutter: Secondary | ICD-10-CM

## 2010-07-13 NOTE — Progress Notes (Signed)
The patient presents today for routine electrophysiology followup.  Since last being seen in our clinic, the patient reports doing reasonably well.  She is not very active because of her age.  Today, she denies symptoms of palpitations, chest pain, shortness of breath, orthopnea, PND, lower extremity edema, dizziness, presyncope, syncope, or neurologic sequela.  The patient feels that she is tolerating medications without difficulties and is otherwise without complaint today.   Past Medical History  Diagnosis Date  . CAD (coronary artery disease)   . HLD (hyperlipidemia)   . HTN (hypertension)   . CVD (cerebrovascular disease)   . Atrial fibrillation   . Renal insufficiency   . Tachycardia-bradycardia syndrome    Past Surgical History  Procedure Date  . Pacemaker insertion     medtronic by JA for tachy/brady syndrome  . Coronary artery bypass graft     Current Outpatient Prescriptions  Medication Sig Dispense Refill  . Ascorbic Acid (VITAMIN C) 500 MG tablet Take 500 mg by mouth daily.        Marland Kitchen atorvastatin (LIPITOR) 80 MG tablet take 1 tablet by mouth at bedtime  30 tablet  11  . docusate sodium (COLACE) 100 MG capsule Take 100 mg by mouth 2 (two) times daily.        . ergocalciferol (VITAMIN D2) 50000 UNITS capsule Take 50,000 Units by mouth once a week.        . ferrous sulfate 325 (65 FE) MG tablet Take 325 mg by mouth daily with breakfast.        . levothyroxine (SYNTHROID, LEVOTHROID) 50 MCG tablet Take 50 mcg by mouth daily.        . Multiple Vitamin (MULTIVITAMIN) tablet Take 1 tablet by mouth daily.        . Multiple Vitamins-Minerals (ICAPS MV PO) Take 1 capsule by mouth daily.        Marland Kitchen NIFEdipine (PROCARDIA-XL) 60 MG (OSM) 24 hr tablet Take 60 mg by mouth daily.        . risedronate (ACTONEL) 35 MG tablet Take 35 mg by mouth every 30 (thirty) days. with water on empty stomach, nothing by mouth or lie down for next 30 minutes.       . triamterene-hydrochlorothiazide (DYAZIDE)  37.5-25 MG per capsule Take 1 capsule by mouth every morning.        . warfarin (COUMADIN) 1 MG tablet use as directed BY ANTICOAGUALTION CLINIC  40 tablet  3    Allergies  Allergen Reactions  . Atracurium   . Cephalosporins   . Codeine   . Nitrofurantoin   . Paroxetine   . Penicillins   . Sulfonamide Derivatives   . Tetracycline     History   Social History  . Marital Status: Married    Spouse Name: N/A    Number of Children: N/A  . Years of Education: N/A   Occupational History  . Not on file.   Social History Main Topics  . Smoking status: Former Games developer  . Smokeless tobacco: Not on file  . Alcohol Use: No  . Drug Use: Not on file  . Sexually Active: Not on file   Other Topics Concern  . Not on file   Social History Narrative  . No narrative on file    Family History  Problem Relation Age of Onset  . Coronary artery disease Sister    Physical Exam: Filed Vitals:   07/13/10 1149  BP: 161/65  Pulse: 58  Resp: 16  Height:  5\' 6"  (1.676 m)  Weight: 121 lb (54.885 kg)    GEN- The patient is elderly and thin appearing, alert and oriented x 3 today.   Head- normocephalic, atraumatic Eyes-  Sclera clear, conjunctiva pink Ears- hearing intact Oropharynx- clear Neck- supple, no JVP Lymph- no cervical lymphadenopathy Lungs- Clear to ausculation bilaterally, normal work of breathing Chest- pacemaker pocket is well healed Heart- irregular rate and rhythm  GI- soft, NT, ND, + BS Extremities- no clubbing, cyanosis, or edema MS- age appropriate muscle atrophy, ambulates by wheelchair Skin- no rash or lesion Psych- euthymic mood, full affect Neuro- strength and sensation are intact  Pacemaker interrogation- reviewed in detail today,  See PACEART report  Assessment and Plan:

## 2010-07-13 NOTE — Patient Instructions (Signed)
Your physician wants you to follow-up in: 6 months with device clinic You will receive a reminder letter in the mail two months in advance. If you don't receive a letter, please call our office to schedule the follow-up appointment.  

## 2010-07-13 NOTE — Assessment & Plan Note (Signed)
Above goal today He daughter will follow her BP closely at home.

## 2010-07-13 NOTE — Assessment & Plan Note (Signed)
Reasonably well rate controlled We may have to add metoprolol if her V rates remain elevated, though she has not well tolerated ventricular pacing previously

## 2010-07-13 NOTE — Assessment & Plan Note (Signed)
Normal pacemaker function See Pace Art report No changes today  

## 2010-07-18 IMAGING — CR DG CHEST 2V
2 series · 2 of 2 positions shown · non-contrast
Comparison: None

CLINICAL DATA: Palpitations.  History of open heart surgery.
History of hypertension, diabetes, stent.

CHEST - 2 VIEW

[w chest pa]
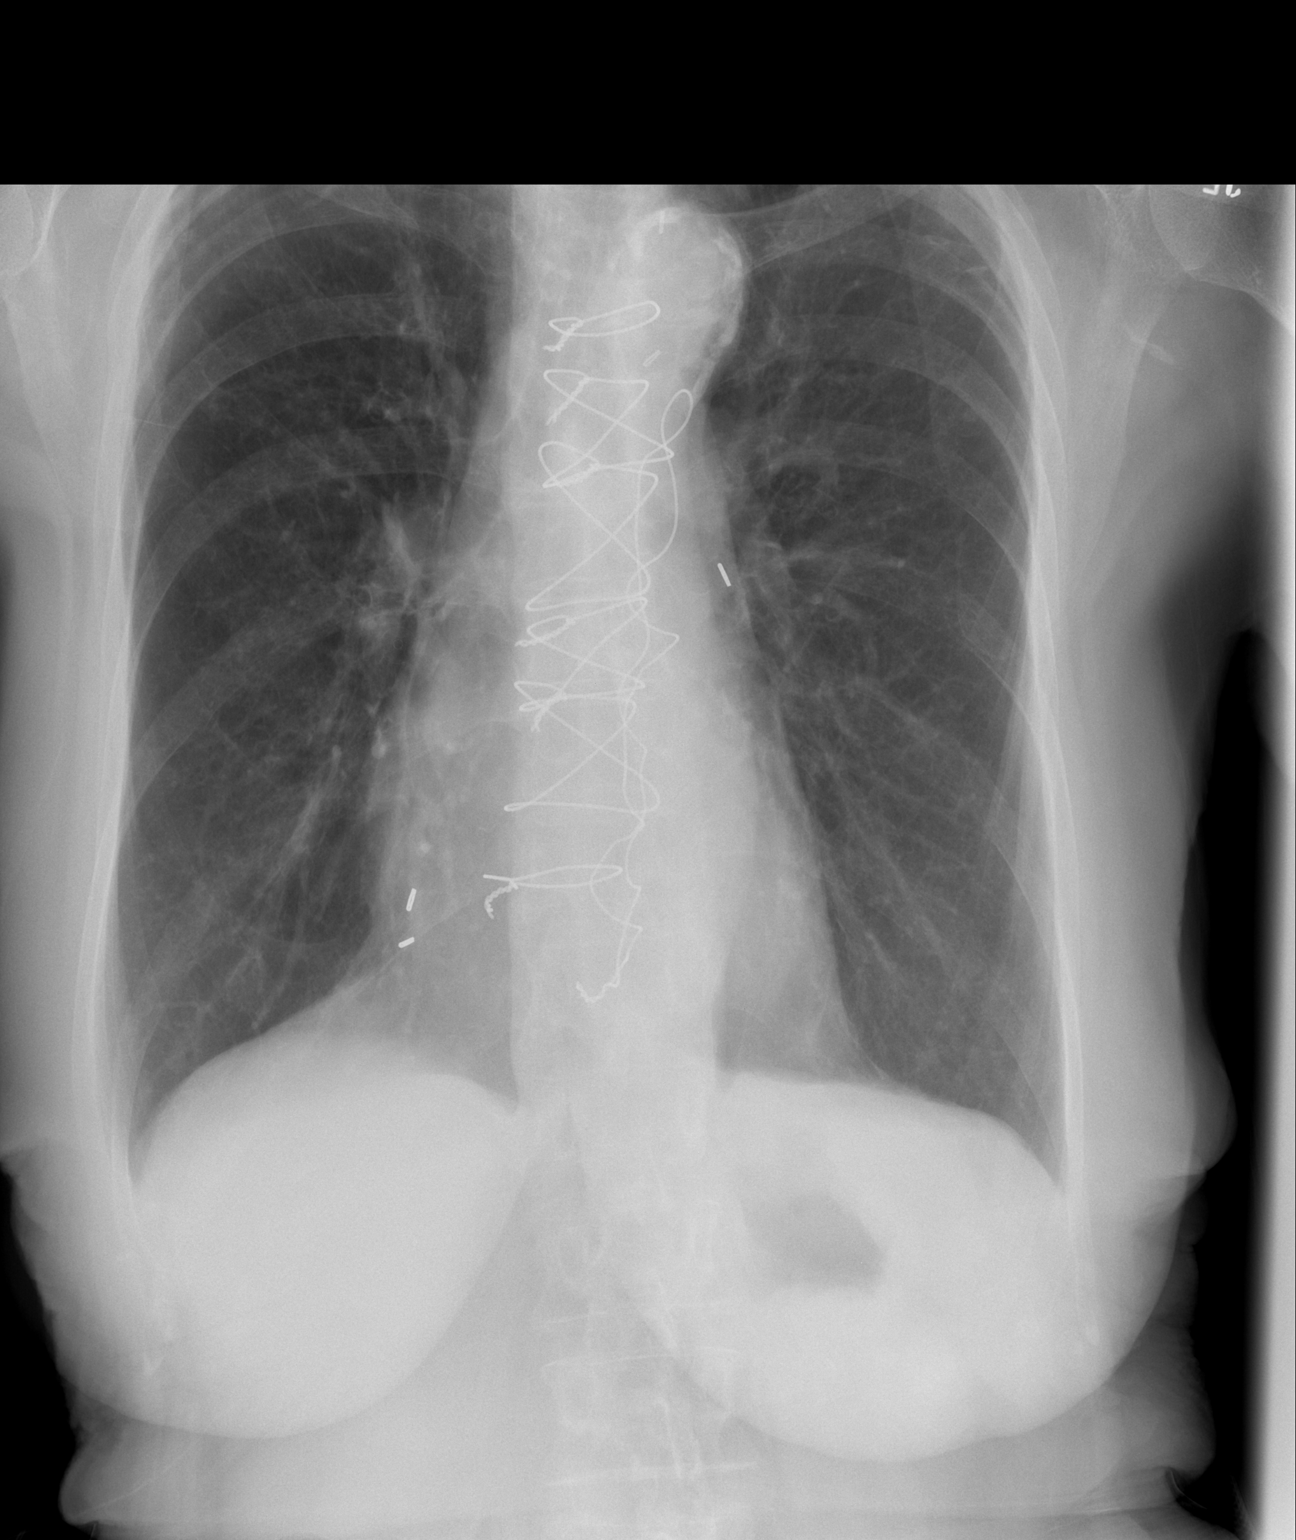

[w chest lat]
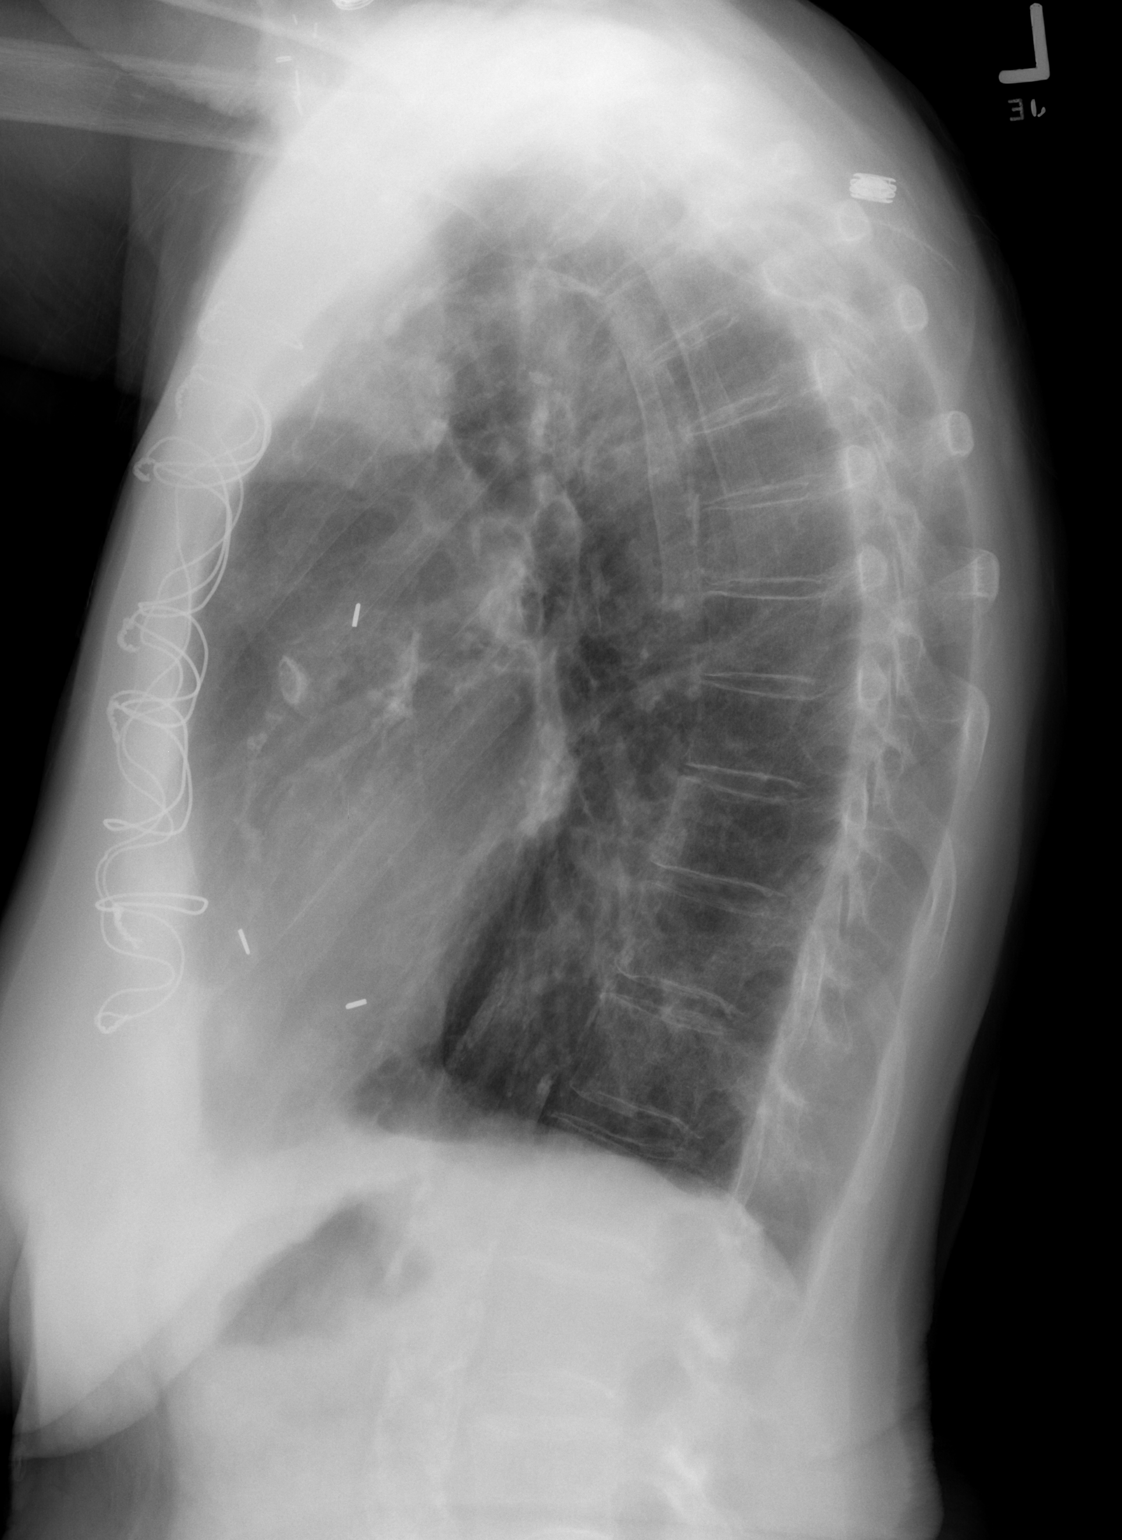

[2 of 2 positions shown; findings below may reference images not displayed]

FINDINGS: Lungs are markedly hyperinflated.  There is perihilar
bronchitic change.  Right apical scarring is present.  No focal
consolidations or pleural effusions are identified.  Heart size is
normal.  No pulmonary edema.  Patient has had a median sternotomy.
There is dense atherosclerotic calcification of the aorta.
Surgical clips are identified in the lower back.
IMPRESSION: 1.  COPD and bronchitic change.
2.  No focal pulmonary abnormality.

## 2010-07-28 IMAGING — CR DG CHEST 1V PORT
1 series · 1 of 1 positions shown · non-contrast
Comparison: 06/13/2009.

CLINICAL DATA: Preop of pacemaker.High blood pressure.  History of
atrial fibrillation.  Nonsmoker.

PORTABLE CHEST - 1 VIEW

[view not recorded]
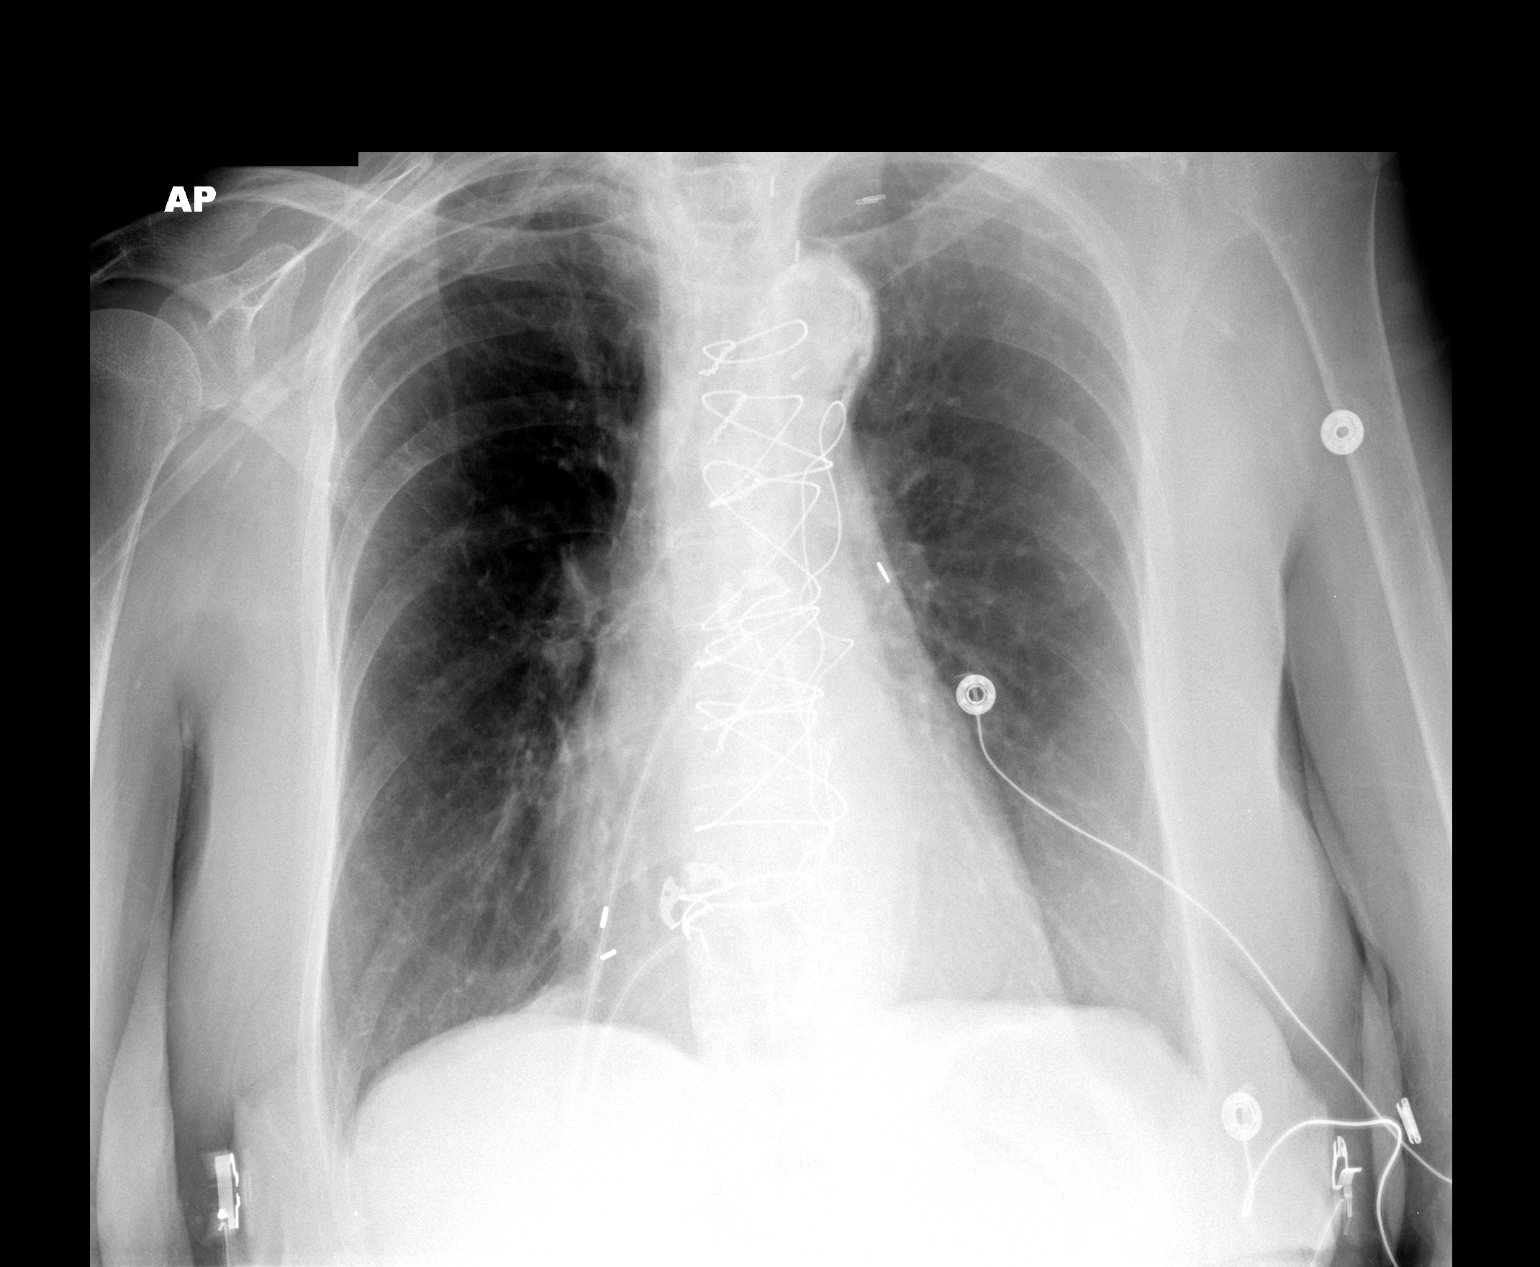

[1 of 1 positions shown; findings below may reference images not displayed]

FINDINGS: Status post median sternotomy.  Heart size top normal.
Calcified tortuous aorta. No infiltrate, congestive heart failure
or pneumothorax.
IMPRESSION: No infiltrate, congestive heart failure or pneumothorax.

Calcified mildly tortuous aorta.

Heart size top normal.

## 2010-07-30 IMAGING — CR DG CHEST 2V
1 series · 1 of 1 positions shown · non-contrast
Comparison: 06/23/2009

CLINICAL DATA: Sinus pauses.  Post pacemaker.

CHEST - 2 VIEW

[w chest lat]
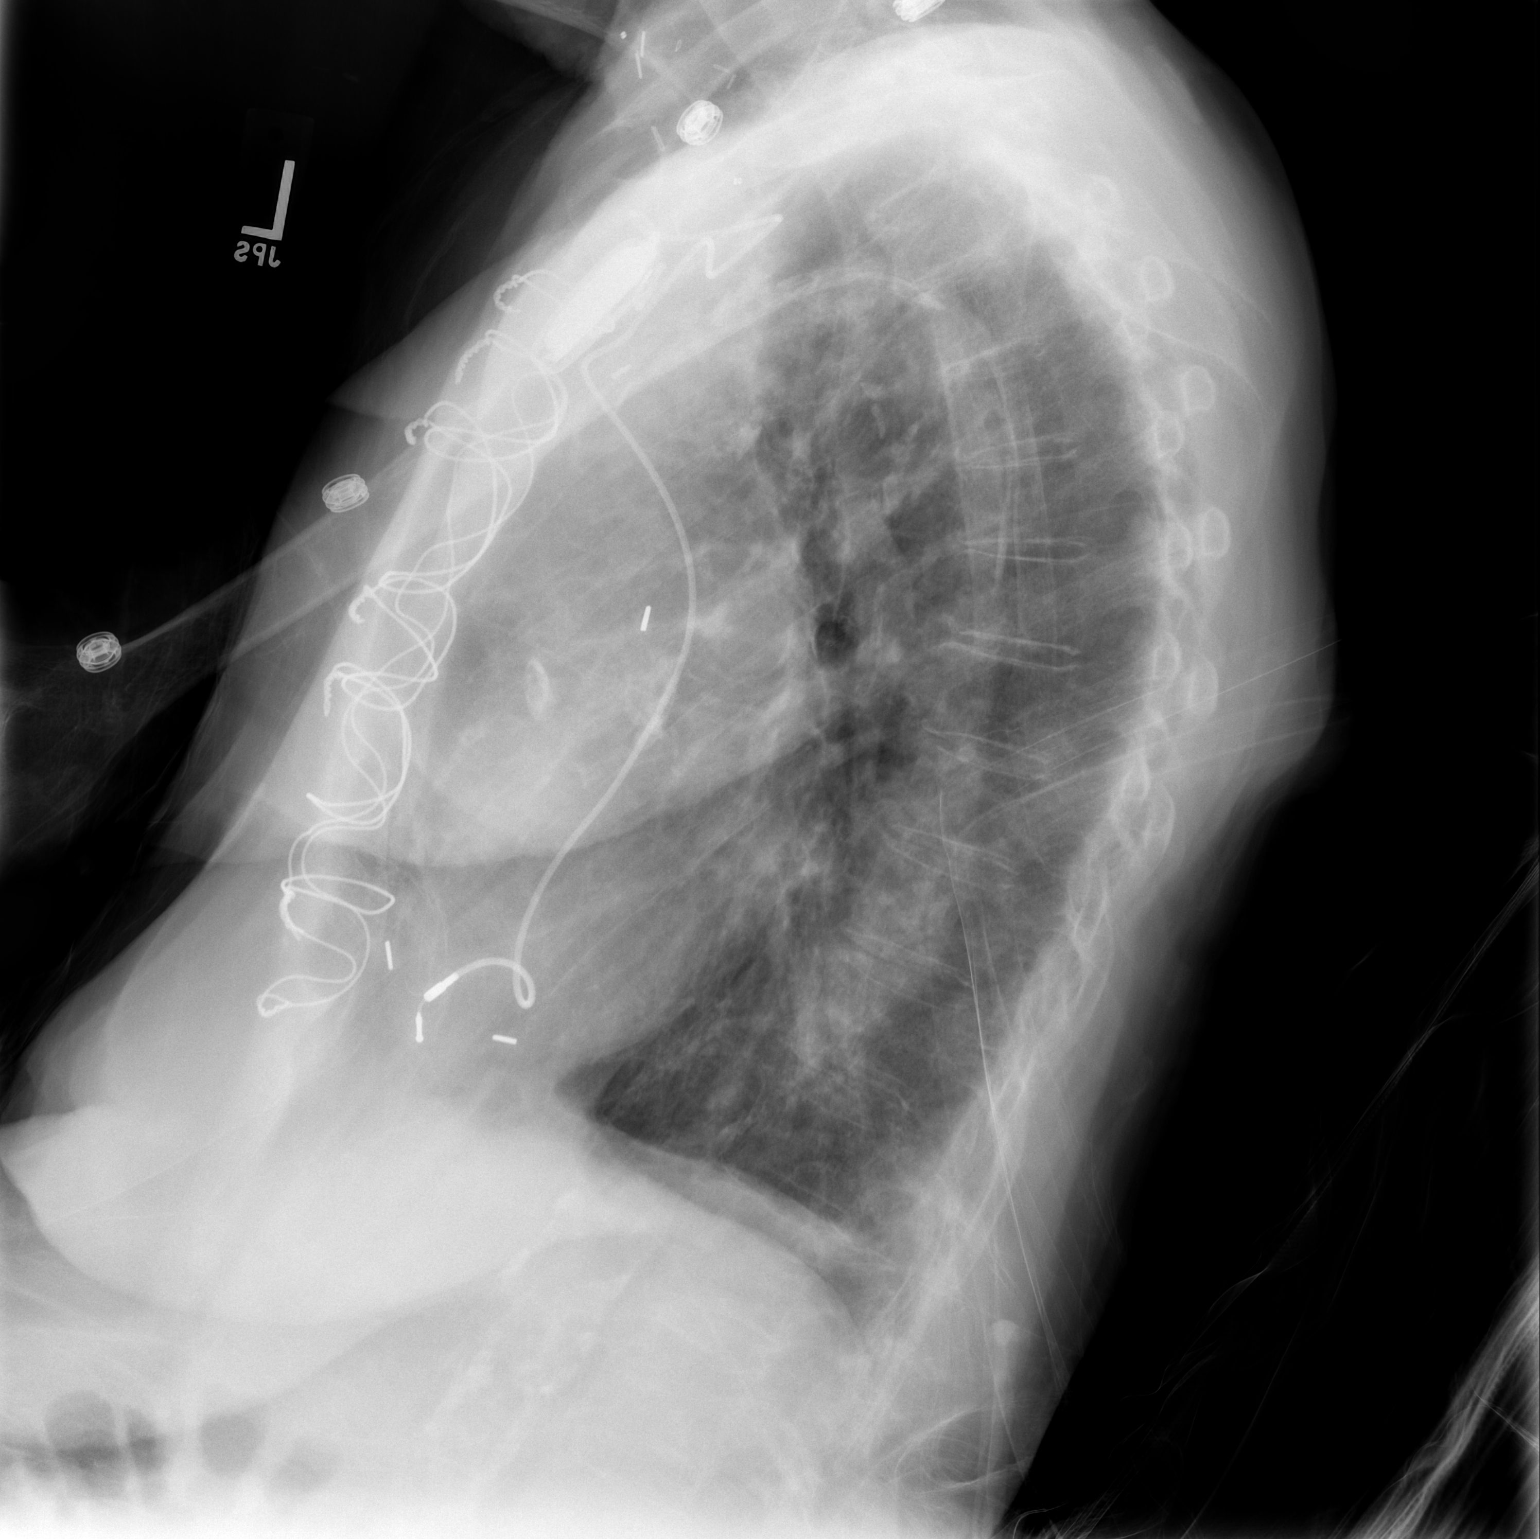

[1 of 1 positions shown; findings below may reference images not displayed]

FINDINGS: Pacer generator pack is noted in the left pectoral
region.  There is an intact lead directed towards the apex of the
right ventricle.  No pneumothorax.  No acute chest findings.  COPD.
IMPRESSION: Status post placement of pacemaker.  No acute chest findings.

## 2010-08-07 ENCOUNTER — Ambulatory Visit (INDEPENDENT_AMBULATORY_CARE_PROVIDER_SITE_OTHER): Payer: Medicare Other | Admitting: *Deleted

## 2010-08-07 DIAGNOSIS — Z7901 Long term (current) use of anticoagulants: Secondary | ICD-10-CM

## 2010-08-07 DIAGNOSIS — I4891 Unspecified atrial fibrillation: Secondary | ICD-10-CM

## 2010-08-07 DIAGNOSIS — I679 Cerebrovascular disease, unspecified: Secondary | ICD-10-CM

## 2010-08-07 DIAGNOSIS — I4892 Unspecified atrial flutter: Secondary | ICD-10-CM

## 2010-08-10 ENCOUNTER — Encounter: Payer: Medicare Other | Admitting: *Deleted

## 2010-09-04 ENCOUNTER — Ambulatory Visit (INDEPENDENT_AMBULATORY_CARE_PROVIDER_SITE_OTHER): Payer: Medicare Other | Admitting: *Deleted

## 2010-09-04 DIAGNOSIS — Z7901 Long term (current) use of anticoagulants: Secondary | ICD-10-CM

## 2010-09-04 DIAGNOSIS — I4891 Unspecified atrial fibrillation: Secondary | ICD-10-CM

## 2010-09-04 DIAGNOSIS — I4892 Unspecified atrial flutter: Secondary | ICD-10-CM

## 2010-09-04 DIAGNOSIS — I679 Cerebrovascular disease, unspecified: Secondary | ICD-10-CM

## 2010-09-04 LAB — POCT INR: INR: 2.6

## 2010-10-02 ENCOUNTER — Ambulatory Visit (INDEPENDENT_AMBULATORY_CARE_PROVIDER_SITE_OTHER): Payer: Medicare Other | Admitting: *Deleted

## 2010-10-02 DIAGNOSIS — I4892 Unspecified atrial flutter: Secondary | ICD-10-CM

## 2010-10-02 DIAGNOSIS — I4891 Unspecified atrial fibrillation: Secondary | ICD-10-CM

## 2010-10-02 DIAGNOSIS — Z7901 Long term (current) use of anticoagulants: Secondary | ICD-10-CM

## 2010-10-02 DIAGNOSIS — I679 Cerebrovascular disease, unspecified: Secondary | ICD-10-CM

## 2010-10-02 LAB — POCT INR: INR: 2.8

## 2010-10-26 ENCOUNTER — Other Ambulatory Visit: Payer: Self-pay | Admitting: Cardiology

## 2010-10-30 ENCOUNTER — Ambulatory Visit (INDEPENDENT_AMBULATORY_CARE_PROVIDER_SITE_OTHER): Payer: Medicare Other | Admitting: *Deleted

## 2010-10-30 DIAGNOSIS — I4892 Unspecified atrial flutter: Secondary | ICD-10-CM

## 2010-10-30 DIAGNOSIS — I4891 Unspecified atrial fibrillation: Secondary | ICD-10-CM

## 2010-10-30 DIAGNOSIS — Z7901 Long term (current) use of anticoagulants: Secondary | ICD-10-CM

## 2010-10-30 DIAGNOSIS — I679 Cerebrovascular disease, unspecified: Secondary | ICD-10-CM

## 2010-10-30 LAB — POCT INR: INR: 2.8

## 2010-11-25 ENCOUNTER — Encounter: Payer: Medicare Other | Admitting: *Deleted

## 2010-11-27 ENCOUNTER — Ambulatory Visit (INDEPENDENT_AMBULATORY_CARE_PROVIDER_SITE_OTHER): Payer: Medicare Other | Admitting: *Deleted

## 2010-11-27 DIAGNOSIS — Z7901 Long term (current) use of anticoagulants: Secondary | ICD-10-CM

## 2010-11-27 DIAGNOSIS — I4891 Unspecified atrial fibrillation: Secondary | ICD-10-CM

## 2010-11-27 DIAGNOSIS — I4892 Unspecified atrial flutter: Secondary | ICD-10-CM

## 2010-11-27 DIAGNOSIS — I679 Cerebrovascular disease, unspecified: Secondary | ICD-10-CM

## 2010-11-27 LAB — POCT INR: INR: 2.5

## 2010-12-25 ENCOUNTER — Encounter: Payer: Self-pay | Admitting: Cardiology

## 2010-12-25 ENCOUNTER — Ambulatory Visit (INDEPENDENT_AMBULATORY_CARE_PROVIDER_SITE_OTHER): Payer: Medicare Other | Admitting: *Deleted

## 2010-12-25 ENCOUNTER — Ambulatory Visit (INDEPENDENT_AMBULATORY_CARE_PROVIDER_SITE_OTHER): Payer: Medicare Other | Admitting: Cardiology

## 2010-12-25 DIAGNOSIS — I2510 Atherosclerotic heart disease of native coronary artery without angina pectoris: Secondary | ICD-10-CM

## 2010-12-25 DIAGNOSIS — I6529 Occlusion and stenosis of unspecified carotid artery: Secondary | ICD-10-CM

## 2010-12-25 DIAGNOSIS — I4892 Unspecified atrial flutter: Secondary | ICD-10-CM

## 2010-12-25 DIAGNOSIS — I10 Essential (primary) hypertension: Secondary | ICD-10-CM

## 2010-12-25 DIAGNOSIS — Z7901 Long term (current) use of anticoagulants: Secondary | ICD-10-CM

## 2010-12-25 DIAGNOSIS — I4891 Unspecified atrial fibrillation: Secondary | ICD-10-CM

## 2010-12-25 DIAGNOSIS — I679 Cerebrovascular disease, unspecified: Secondary | ICD-10-CM

## 2010-12-25 DIAGNOSIS — Z950 Presence of cardiac pacemaker: Secondary | ICD-10-CM

## 2010-12-25 DIAGNOSIS — E785 Hyperlipidemia, unspecified: Secondary | ICD-10-CM

## 2010-12-25 LAB — POCT INR: INR: 4.3

## 2010-12-25 MED ORDER — METOPROLOL SUCCINATE ER 25 MG PO TB24: 25.0000 mg | ORAL_TABLET | Freq: Every day | ORAL | Status: DC

## 2010-12-25 NOTE — Assessment & Plan Note (Signed)
Management per electrophysiology. 

## 2010-12-25 NOTE — Assessment & Plan Note (Signed)
Continue statin. Lipids and liver monitored by primary care. 

## 2010-12-25 NOTE — Progress Notes (Signed)
HPI: Jill Houston is a pleasant female who I have seen in the past for coronary artery disease status post coronary artery bypass and graft. She also has cerebrovascular disease.  Her last Myoview on November 10, 2007 showed prior distal anterior infarct and  periinfarct ischemia.  The ejection fraction was 83%. It was unchanged compared to previous. We have treated medically as she has had no symptoms. Last carotid Dopplers were performed in Nov 2011  showed a 40-59% bilateral stenosis  and followup recommended in one year. She has also been found to have paroxysmal atrial fibrillation/flutter. Echocardiogram in May 2011 revealed normal LV function, mild aortic, mitral and tricuspid regurgitation. She has had a pacemaker placed for symptomatic bradycardia. Note this was a single chamber pacemaker. When she was ventricular paced her blood pressure decreased. I last saw her in May of 2012. Since then, the patient's daughter states patient is having worsening dementia. She states she has a cold but otherwise denies dyspnea, chest pain, palpitations, syncope or falls. No bleeding.    Current Outpatient Prescriptions  Medication Sig Dispense Refill  . Ascorbic Acid (VITAMIN C) 500 MG tablet Take 500 mg by mouth daily.        Marland Kitchen atorvastatin (LIPITOR) 80 MG tablet take 1 tablet by mouth at bedtime  30 tablet  11  . docusate sodium (COLACE) 100 MG capsule Take 100 mg by mouth 2 (two) times daily.        . ergocalciferol (VITAMIN D2) 50000 UNITS capsule Take 50,000 Units by mouth once a week.        . ferrous sulfate 325 (65 FE) MG tablet Take 325 mg by mouth daily with breakfast.        . levothyroxine (SYNTHROID, LEVOTHROID) 50 MCG tablet Take 75 mcg by mouth daily.       . Multiple Vitamin (MULTIVITAMIN) tablet Take 1 tablet by mouth daily.        . Multiple Vitamins-Minerals (ICAPS MV PO) Take 1 capsule by mouth daily.        Marland Kitchen NIFEdipine (PROCARDIA XL/ADALAT-CC) 60 MG 24 hr tablet Take 60 mg by mouth daily.         . risedronate (ACTONEL) 35 MG tablet Take 35 mg by mouth every 30 (thirty) days. with water on empty stomach, nothing by mouth or lie down for next 30 minutes.       . warfarin (COUMADIN) 1 MG tablet TAKE TWO TABLETS BY MOUTH ON MONDAY AND FRIDAY. TAKE 1 TABLET DAILY ONALL OTHER DAYS  40 tablet  3     Past Medical History  Diagnosis Date  . CAD (coronary artery disease)   . HLD (hyperlipidemia)   . HTN (hypertension)   . CVD (cerebrovascular disease)   . Atrial fibrillation   . Renal insufficiency   . Tachycardia-bradycardia syndrome     Past Surgical History  Procedure Date  . Pacemaker insertion     medtronic by JA for tachy/brady syndrome  . Coronary artery bypass graft     History   Social History  . Marital Status: Married    Spouse Name: N/A    Number of Children: N/A  . Years of Education: N/A   Occupational History  . Not on file.   Social History Main Topics  . Smoking status: Former Games developer  . Smokeless tobacco: Not on file  . Alcohol Use: No  . Drug Use: Not on file  . Sexually Active: Not on file   Other Topics Concern  .  Not on file   Social History Narrative  . No narrative on file    ROS: Declining memory and arthritis but no fevers or chills, productive cough, hemoptysis, dysphasia, odynophagia, melena, hematochezia, dysuria, hematuria, rash, seizure activity, orthopnea, PND, pedal edema, claudication. Remaining systems are negative.  Physical Exam: Well-developed frail in no acute distress.  Skin is warm and dry.  HEENT is normal.  Neck is supple. No thyromegaly.  Chest is clear to auscultation with normal expansion. Status post sternotomy Cardiovascular exam is irregular and tachycardic  Abdominal exam nontender or distended. No masses palpated. Extremities show no edema. neuro grossly intact  ECG atrial fibrillation at a rate of 113. Left axis deviation. Nonspecific ST changes.

## 2010-12-25 NOTE — Patient Instructions (Signed)
Your physician wants you to follow-up in: 6 months You will receive a reminder letter in the mail two months in advance. If you don't receive a letter, please call our office to schedule the follow-up appointment.   Your physician has requested that you have a carotid duplex. This test is an ultrasound of the carotid arteries in your neck. It looks at blood flow through these arteries that supply the brain with blood. Allow one hour for this exam. There are no restrictions or special instructions.   START METOPROLOL SUCC 25 MG ONCE DAILY

## 2010-12-25 NOTE — Assessment & Plan Note (Signed)
Continue statin. No aspirin given Coumadin use. Conservative measures given age and overall medical condition.

## 2010-12-25 NOTE — Assessment & Plan Note (Signed)
Blood pressure controlled. Continue present medications. 

## 2010-12-25 NOTE — Assessment & Plan Note (Addendum)
Patient back in atrial fibrillation. Heart rate mildly elevated. Add Toprol 25 mg daily. Continue Coumadin. Hemoglobin monitored by primary care. Note she has no history of fall. Given that she is asymptomatic plan rate control and anticoagulation.

## 2010-12-25 NOTE — Assessment & Plan Note (Signed)
Continue statin. Follow-up carotid Dopplers. 

## 2011-01-08 ENCOUNTER — Ambulatory Visit (INDEPENDENT_AMBULATORY_CARE_PROVIDER_SITE_OTHER): Payer: Medicare Other | Admitting: *Deleted

## 2011-01-08 DIAGNOSIS — I4892 Unspecified atrial flutter: Secondary | ICD-10-CM

## 2011-01-08 DIAGNOSIS — Z7901 Long term (current) use of anticoagulants: Secondary | ICD-10-CM

## 2011-01-08 DIAGNOSIS — I4891 Unspecified atrial fibrillation: Secondary | ICD-10-CM

## 2011-01-08 DIAGNOSIS — I679 Cerebrovascular disease, unspecified: Secondary | ICD-10-CM

## 2011-01-08 LAB — POCT INR: INR: 2.3

## 2011-01-22 ENCOUNTER — Encounter: Payer: Self-pay | Admitting: Internal Medicine

## 2011-01-22 ENCOUNTER — Ambulatory Visit (INDEPENDENT_AMBULATORY_CARE_PROVIDER_SITE_OTHER): Payer: Medicare Other | Admitting: Internal Medicine

## 2011-01-22 DIAGNOSIS — I495 Sick sinus syndrome: Secondary | ICD-10-CM

## 2011-01-22 DIAGNOSIS — I4891 Unspecified atrial fibrillation: Secondary | ICD-10-CM

## 2011-01-22 DIAGNOSIS — Z950 Presence of cardiac pacemaker: Secondary | ICD-10-CM

## 2011-01-22 LAB — PACEMAKER DEVICE OBSERVATION
BMOD-0005RV: 95 {beats}/min
BRDY-0002RV: 40 {beats}/min
RV LEAD AMPLITUDE: 22.4 mv
VENTRICULAR PACING PM: 2

## 2011-01-22 NOTE — Progress Notes (Signed)
The patient presents today for routine electrophysiology followup.  Since last being seen in our clinic, the patient reports doing reasonably well.  She is not very active because of her age.  Today, she denies symptoms of palpitations, chest pain, shortness of breath, orthopnea, PND, lower extremity edema, dizziness, presyncope, syncope, or neurologic sequela.  The patient feels that she is tolerating medications without difficulties and is otherwise without complaint today.   Past Medical History  Diagnosis Date  . CAD (coronary artery disease)   . HLD (hyperlipidemia)   . HTN (hypertension)   . CVD (cerebrovascular disease)   . Atrial fibrillation   . Renal insufficiency   . Tachycardia-bradycardia syndrome    Past Surgical History  Procedure Date  . Pacemaker insertion     medtronic by JA for tachy/brady syndrome  . Coronary artery bypass graft     Current Outpatient Prescriptions  Medication Sig Dispense Refill  . Ascorbic Acid (VITAMIN C) 500 MG tablet Take 500 mg by mouth daily.        Marland Kitchen atorvastatin (LIPITOR) 80 MG tablet take 1 tablet by mouth at bedtime  30 tablet  11  . docusate sodium (COLACE) 100 MG capsule Take 100 mg by mouth 2 (two) times daily.        . ergocalciferol (VITAMIN D2) 50000 UNITS capsule Take 50,000 Units by mouth once a week.        . ferrous sulfate 325 (65 FE) MG tablet Take 325 mg by mouth daily with breakfast.        . levothyroxine (SYNTHROID, LEVOTHROID) 50 MCG tablet Take 88 mcg by mouth daily.       . metoprolol succinate (TOPROL XL) 25 MG 24 hr tablet Take 1 tablet (25 mg total) by mouth daily.  30 tablet  11  . Multiple Vitamin (MULTIVITAMIN) tablet Take 1 tablet by mouth daily.        . Multiple Vitamins-Minerals (ICAPS MV PO) Take 1 capsule by mouth daily.        Marland Kitchen NIFEdipine (PROCARDIA XL/ADALAT-CC) 60 MG 24 hr tablet Take 60 mg by mouth daily.        . risedronate (ACTONEL) 35 MG tablet Take 35 mg by mouth every 30 (thirty) days. with water  on empty stomach, nothing by mouth or lie down for next 30 minutes.       . warfarin (COUMADIN) 1 MG tablet TAKE TWO TABLETS BY MOUTH ON MONDAY AND FRIDAY. TAKE 1 TABLET DAILY ONALL OTHER DAYS  40 tablet  3    Allergies  Allergen Reactions  . Atracurium   . Cephalosporins   . Codeine   . Nitrofurantoin   . Paroxetine   . Penicillins   . Sulfonamide Derivatives   . Tetracycline     History   Social History  . Marital Status: Married    Spouse Name: N/A    Number of Children: N/A  . Years of Education: N/A   Occupational History  . Not on file.   Social History Main Topics  . Smoking status: Former Games developer  . Smokeless tobacco: Not on file  . Alcohol Use: No  . Drug Use: Not on file  . Sexually Active: Not on file   Other Topics Concern  . Not on file   Social History Narrative  . No narrative on file    Family History  Problem Relation Age of Onset  . Coronary artery disease Sister    Physical Exam: Filed Vitals:  01/22/11 1107  BP: 106/66  Pulse: 78  Height: 5\' 5"  (1.651 m)  Weight: 118 lb (53.524 kg)    GEN- The patient is elderly and thin appearing, alert and oriented x 3 today.   Head- normocephalic, atraumatic Eyes-  Sclera clear, conjunctiva pink Ears- hearing intact Oropharynx- clear Neck- supple, no JVP Lymph- no cervical lymphadenopathy Lungs- Clear to ausculation bilaterally, normal work of breathing Chest- pacemaker pocket is well healed Heart- irregular rate and rhythm  GI- soft, NT, ND, + BS Extremities- no clubbing, cyanosis, or edema MS- age appropriate muscle atrophy, ambulates by wheelchair Skin- no rash or lesion Psych- euthymic mood, full affect Neuro- strength and sensation are intact  Pacemaker interrogation- reviewed in detail today,  See PACEART report  Assessment and Plan:

## 2011-01-22 NOTE — Assessment & Plan Note (Signed)
Normal pacemaker function See Arita Miss Art report No changes today   V rates at times elevated, though I am reluctant to make changes today

## 2011-01-22 NOTE — Assessment & Plan Note (Signed)
Goal INR 2-3

## 2011-01-22 NOTE — Patient Instructions (Signed)
Your physician wants you to follow-up in: 6 months with the device clinic and 12 months with Dr Johney Frame Bonita Quin will receive a reminder letter in the mail two months in advance. If you don't receive a letter, please call our office to schedule the follow-up appointment.

## 2011-01-25 ENCOUNTER — Ambulatory Visit (INDEPENDENT_AMBULATORY_CARE_PROVIDER_SITE_OTHER): Payer: Medicare Other | Admitting: *Deleted

## 2011-01-25 ENCOUNTER — Encounter (INDEPENDENT_AMBULATORY_CARE_PROVIDER_SITE_OTHER): Payer: Medicare Other | Admitting: Cardiology

## 2011-01-25 DIAGNOSIS — I4891 Unspecified atrial fibrillation: Secondary | ICD-10-CM

## 2011-01-25 DIAGNOSIS — I679 Cerebrovascular disease, unspecified: Secondary | ICD-10-CM

## 2011-01-25 DIAGNOSIS — I6529 Occlusion and stenosis of unspecified carotid artery: Secondary | ICD-10-CM

## 2011-01-25 DIAGNOSIS — Z7901 Long term (current) use of anticoagulants: Secondary | ICD-10-CM

## 2011-01-25 DIAGNOSIS — I4892 Unspecified atrial flutter: Secondary | ICD-10-CM

## 2011-01-25 LAB — POCT INR: INR: 2.7

## 2011-02-22 ENCOUNTER — Encounter: Payer: Medicare Other | Admitting: *Deleted

## 2011-02-26 ENCOUNTER — Encounter: Payer: Medicare Other | Admitting: *Deleted

## 2011-03-01 ENCOUNTER — Ambulatory Visit (INDEPENDENT_AMBULATORY_CARE_PROVIDER_SITE_OTHER): Payer: Medicare Other | Admitting: *Deleted

## 2011-03-01 DIAGNOSIS — I4892 Unspecified atrial flutter: Secondary | ICD-10-CM

## 2011-03-01 DIAGNOSIS — Z7901 Long term (current) use of anticoagulants: Secondary | ICD-10-CM

## 2011-03-01 DIAGNOSIS — I4891 Unspecified atrial fibrillation: Secondary | ICD-10-CM

## 2011-03-01 DIAGNOSIS — I679 Cerebrovascular disease, unspecified: Secondary | ICD-10-CM

## 2011-03-25 ENCOUNTER — Other Ambulatory Visit: Payer: Self-pay | Admitting: Cardiology

## 2011-03-29 ENCOUNTER — Ambulatory Visit (INDEPENDENT_AMBULATORY_CARE_PROVIDER_SITE_OTHER): Payer: Medicare Other | Admitting: Pharmacist

## 2011-03-29 DIAGNOSIS — I4891 Unspecified atrial fibrillation: Secondary | ICD-10-CM

## 2011-03-29 DIAGNOSIS — I4892 Unspecified atrial flutter: Secondary | ICD-10-CM

## 2011-03-29 DIAGNOSIS — I679 Cerebrovascular disease, unspecified: Secondary | ICD-10-CM

## 2011-03-29 DIAGNOSIS — Z7901 Long term (current) use of anticoagulants: Secondary | ICD-10-CM

## 2011-04-26 ENCOUNTER — Ambulatory Visit (INDEPENDENT_AMBULATORY_CARE_PROVIDER_SITE_OTHER): Payer: Medicare Other | Admitting: Pharmacist

## 2011-04-26 DIAGNOSIS — I4892 Unspecified atrial flutter: Secondary | ICD-10-CM

## 2011-04-26 DIAGNOSIS — I679 Cerebrovascular disease, unspecified: Secondary | ICD-10-CM

## 2011-04-26 DIAGNOSIS — Z7901 Long term (current) use of anticoagulants: Secondary | ICD-10-CM

## 2011-04-26 DIAGNOSIS — I4891 Unspecified atrial fibrillation: Secondary | ICD-10-CM

## 2011-05-10 ENCOUNTER — Ambulatory Visit (INDEPENDENT_AMBULATORY_CARE_PROVIDER_SITE_OTHER): Payer: Medicare Other | Admitting: Pharmacist

## 2011-05-10 DIAGNOSIS — I679 Cerebrovascular disease, unspecified: Secondary | ICD-10-CM

## 2011-05-10 DIAGNOSIS — Z7901 Long term (current) use of anticoagulants: Secondary | ICD-10-CM

## 2011-05-10 DIAGNOSIS — I4891 Unspecified atrial fibrillation: Secondary | ICD-10-CM

## 2011-05-10 DIAGNOSIS — I4892 Unspecified atrial flutter: Secondary | ICD-10-CM

## 2011-05-31 ENCOUNTER — Ambulatory Visit (INDEPENDENT_AMBULATORY_CARE_PROVIDER_SITE_OTHER): Payer: Medicare Other | Admitting: *Deleted

## 2011-05-31 DIAGNOSIS — I679 Cerebrovascular disease, unspecified: Secondary | ICD-10-CM

## 2011-05-31 DIAGNOSIS — Z7901 Long term (current) use of anticoagulants: Secondary | ICD-10-CM

## 2011-05-31 DIAGNOSIS — I4891 Unspecified atrial fibrillation: Secondary | ICD-10-CM

## 2011-05-31 DIAGNOSIS — I4892 Unspecified atrial flutter: Secondary | ICD-10-CM

## 2011-05-31 LAB — POCT INR: INR: 1.8

## 2011-06-12 ENCOUNTER — Other Ambulatory Visit: Payer: Self-pay | Admitting: Cardiology

## 2011-06-14 ENCOUNTER — Ambulatory Visit (INDEPENDENT_AMBULATORY_CARE_PROVIDER_SITE_OTHER): Payer: Medicare Other

## 2011-06-14 DIAGNOSIS — I4892 Unspecified atrial flutter: Secondary | ICD-10-CM

## 2011-06-14 DIAGNOSIS — I679 Cerebrovascular disease, unspecified: Secondary | ICD-10-CM

## 2011-06-14 DIAGNOSIS — I4891 Unspecified atrial fibrillation: Secondary | ICD-10-CM

## 2011-06-14 DIAGNOSIS — Z7901 Long term (current) use of anticoagulants: Secondary | ICD-10-CM

## 2011-06-14 LAB — POCT INR: INR: 1.6

## 2011-07-02 ENCOUNTER — Ambulatory Visit (INDEPENDENT_AMBULATORY_CARE_PROVIDER_SITE_OTHER): Payer: Medicare Other | Admitting: Cardiology

## 2011-07-02 ENCOUNTER — Ambulatory Visit (INDEPENDENT_AMBULATORY_CARE_PROVIDER_SITE_OTHER): Payer: Medicare Other

## 2011-07-02 ENCOUNTER — Encounter: Payer: Self-pay | Admitting: Cardiology

## 2011-07-02 VITALS — BP 98/78 | HR 84 | Ht 65.0 in | Wt 119.1 lb

## 2011-07-02 DIAGNOSIS — Z7901 Long term (current) use of anticoagulants: Secondary | ICD-10-CM

## 2011-07-02 DIAGNOSIS — I4891 Unspecified atrial fibrillation: Secondary | ICD-10-CM

## 2011-07-02 DIAGNOSIS — I679 Cerebrovascular disease, unspecified: Secondary | ICD-10-CM

## 2011-07-02 DIAGNOSIS — I4892 Unspecified atrial flutter: Secondary | ICD-10-CM

## 2011-07-02 NOTE — Patient Instructions (Signed)
The current medical regimen is effective;  continue present plan and medications.  Follow up in 9 months with Dr Crenshaw.  You will receive a letter in the mail 2 months before you are due.  Please call us when you receive this letter to schedule your follow up appointment.  

## 2011-07-02 NOTE — Assessment & Plan Note (Signed)
Continue statin. Not on aspirin given need for Coumadin. Scheduled for followup carotid Dopplers December 2013.

## 2011-07-02 NOTE — Progress Notes (Signed)
HPI: [name] is a pleasant female who I have seen in the past for coronary artery disease status post coronary artery bypass and graft. She also has cerebrovascular disease. Her last Myoview on November 10, 2007 showed prior distal anterior infarct and periinfarct ischemia. The ejection fraction was 83%. It was unchanged compared to previous. We have treated medically as she has had no symptoms. Last carotid Dopplers were performed in Dec 2012 and showed a 40-59% bilateral stenosis and followup recommended in one year. She has also been found to have paroxysmal atrial fibrillation/flutter. Echocardiogram in May 2011 revealed normal LV function, mild aortic, mitral and tricuspid regurgitation. She has had a pacemaker placed for symptomatic bradycardia. Note this was a single chamber pacemaker. When she was ventricular paced her blood pressure decreased. I last saw her in Nov of 2012. Since then, the patient denies any dyspnea on exertion, orthopnea, PND, pedal edema, palpitations, syncope or chest pain.    Current Outpatient Prescriptions  Medication Sig Dispense Refill  . Ascorbic Acid (VITAMIN C) 500 MG tablet Take 500 mg by mouth daily.        Marland Kitchen atorvastatin (LIPITOR) 80 MG tablet take 1 tablet by mouth at bedtime  30 tablet  11  . docusate sodium (COLACE) 100 MG capsule Take 100 mg by mouth 2 (two) times daily.        . ergocalciferol (VITAMIN D2) 50000 UNITS capsule Take 50,000 Units by mouth once a week.        . ferrous sulfate 325 (65 FE) MG tablet Take 325 mg by mouth daily with breakfast.        . levothyroxine (SYNTHROID, LEVOTHROID) 100 MCG tablet Take 100 mcg by mouth daily. 1.25      . metoprolol succinate (TOPROL XL) 25 MG 24 hr tablet Take 1 tablet (25 mg total) by mouth daily.  30 tablet  11  . Multiple Vitamin (MULTIVITAMIN) tablet Take 1 tablet by mouth daily.        . Multiple Vitamins-Minerals (ICAPS MV PO) Take 1 capsule by mouth daily.        Marland Kitchen NIFEdipine (PROCARDIA  XL/ADALAT-CC) 60 MG 24 hr tablet Take 60 mg by mouth daily.        . risedronate (ACTONEL) 35 MG tablet Take 35 mg by mouth every 30 (thirty) days. with water on empty stomach, nothing by mouth or lie down for next 30 minutes.       . triamterene-hydrochlorothiazide (DYAZIDE) 37.5-25 MG per capsule Take 1 capsule by mouth daily.      Marland Kitchen warfarin (COUMADIN) 1 MG tablet TAKE TWO TABLETS BY MOUTH ON MONDAY AND FRIDAY. TAKE 1 TABLET DAILY ONALL OTHER DAYS  40 tablet  1     Past Medical History  Diagnosis Date  . CAD (coronary artery disease)   . HLD (hyperlipidemia)   . HTN (hypertension)   . CVD (cerebrovascular disease)   . Atrial fibrillation   . Renal insufficiency   . Tachycardia-bradycardia syndrome     Past Surgical History  Procedure Date  . Pacemaker insertion     medtronic by JA for tachy/brady syndrome  . Coronary artery bypass graft     History   Social History  . Marital Status: Married    Spouse Name: N/A    Number of Children: N/A  . Years of Education: N/A   Occupational History  . Not on file.   Social History Main Topics  . Smoking status: Former Games developer  .  Smokeless tobacco: Not on file  . Alcohol Use: No  . Drug Use: Not on file  . Sexually Active: Not on file   Other Topics Concern  . Not on file   Social History Narrative  . No narrative on file    ROS: no fevers or chills, productive cough, hemoptysis, dysphasia, odynophagia, melena, hematochezia, dysuria, hematuria, rash, seizure activity, orthopnea, PND, pedal edema, claudication. Remaining systems are negative.  Physical Exam: Well-developed frail in no acute distress.  Skin is warm and dry.  HEENT is normal.  Neck is supple.  Chest is clear to auscultation with normal expansion.  Cardiovascular exam is irregular Abdominal exam nontender or distended. No masses palpated. Extremities show no edema. neuro grossly intact  ECG atrial fibrillation at a rate of 84. Nonspecific ST  changes.

## 2011-07-02 NOTE — Assessment & Plan Note (Signed)
Management per electrophysiology. 

## 2011-07-02 NOTE — Assessment & Plan Note (Signed)
Blood pressure controlled. Continue present medications. Potassium and renal function monitored by primary care. 

## 2011-07-02 NOTE — Assessment & Plan Note (Signed)
Rate controlled. Continue present medications. Continue Coumadin with goal INR 2-3.

## 2011-07-02 NOTE — Assessment & Plan Note (Signed)
Continue statin. Conservative measures given age and overall medical condition.

## 2011-07-02 NOTE — Assessment & Plan Note (Signed)
Continue statin. Lipids and liver monitored by primary care. 

## 2011-07-07 ENCOUNTER — Telehealth: Payer: Self-pay | Admitting: *Deleted

## 2011-07-07 NOTE — Telephone Encounter (Signed)
Talked with Drenda Freeze at [facility] and gave her the current dose of coumadin and request for 50 tablets with 3 refills and she states she will refill this dose

## 2011-07-12 ENCOUNTER — Ambulatory Visit (INDEPENDENT_AMBULATORY_CARE_PROVIDER_SITE_OTHER): Payer: Medicare Other | Admitting: *Deleted

## 2011-07-12 DIAGNOSIS — I4891 Unspecified atrial fibrillation: Secondary | ICD-10-CM

## 2011-07-12 DIAGNOSIS — I679 Cerebrovascular disease, unspecified: Secondary | ICD-10-CM

## 2011-07-12 DIAGNOSIS — Z7901 Long term (current) use of anticoagulants: Secondary | ICD-10-CM

## 2011-07-12 DIAGNOSIS — I4892 Unspecified atrial flutter: Secondary | ICD-10-CM

## 2011-07-12 LAB — POCT INR: INR: 2.6

## 2011-07-26 ENCOUNTER — Ambulatory Visit (INDEPENDENT_AMBULATORY_CARE_PROVIDER_SITE_OTHER): Payer: Medicare Other | Admitting: Pharmacist

## 2011-07-26 DIAGNOSIS — Z7901 Long term (current) use of anticoagulants: Secondary | ICD-10-CM

## 2011-07-26 DIAGNOSIS — I4891 Unspecified atrial fibrillation: Secondary | ICD-10-CM

## 2011-07-26 DIAGNOSIS — I679 Cerebrovascular disease, unspecified: Secondary | ICD-10-CM

## 2011-07-26 DIAGNOSIS — I4892 Unspecified atrial flutter: Secondary | ICD-10-CM

## 2011-07-26 LAB — POCT INR: INR: 1.8

## 2011-08-16 ENCOUNTER — Ambulatory Visit (INDEPENDENT_AMBULATORY_CARE_PROVIDER_SITE_OTHER): Payer: Medicare Other

## 2011-08-16 DIAGNOSIS — I679 Cerebrovascular disease, unspecified: Secondary | ICD-10-CM

## 2011-08-16 DIAGNOSIS — I4892 Unspecified atrial flutter: Secondary | ICD-10-CM

## 2011-08-16 DIAGNOSIS — Z7901 Long term (current) use of anticoagulants: Secondary | ICD-10-CM

## 2011-08-16 DIAGNOSIS — I4891 Unspecified atrial fibrillation: Secondary | ICD-10-CM

## 2011-08-16 LAB — POCT INR: INR: 3.1

## 2011-08-26 ENCOUNTER — Telehealth: Payer: Self-pay | Admitting: Internal Medicine

## 2011-08-26 NOTE — Telephone Encounter (Signed)
08-26-11 lmm for pt to set up pacer ck for device clinic/mt

## 2011-09-06 ENCOUNTER — Ambulatory Visit (INDEPENDENT_AMBULATORY_CARE_PROVIDER_SITE_OTHER): Payer: Medicare Other | Admitting: *Deleted

## 2011-09-06 ENCOUNTER — Encounter: Payer: Self-pay | Admitting: Internal Medicine

## 2011-09-06 DIAGNOSIS — Z7901 Long term (current) use of anticoagulants: Secondary | ICD-10-CM

## 2011-09-06 DIAGNOSIS — I495 Sick sinus syndrome: Secondary | ICD-10-CM

## 2011-09-06 DIAGNOSIS — I679 Cerebrovascular disease, unspecified: Secondary | ICD-10-CM

## 2011-09-06 DIAGNOSIS — I4891 Unspecified atrial fibrillation: Secondary | ICD-10-CM

## 2011-09-06 DIAGNOSIS — I4892 Unspecified atrial flutter: Secondary | ICD-10-CM

## 2011-09-06 LAB — PACEMAKER DEVICE OBSERVATION
BRDY-0002RV: 40 {beats}/min
RV LEAD AMPLITUDE: 15.67 mv
RV LEAD IMPEDENCE PM: 649 Ohm
RV LEAD THRESHOLD: 1.25 V

## 2011-09-06 LAB — POCT INR: INR: 5.4

## 2011-09-06 NOTE — Progress Notes (Signed)
PPM check.  Metoprolol increased to 50mg  daily per Dr. Johney Frame for A-fib with RVR.  B/P today 126/76.

## 2011-09-14 ENCOUNTER — Ambulatory Visit (INDEPENDENT_AMBULATORY_CARE_PROVIDER_SITE_OTHER): Payer: Medicare Other

## 2011-09-14 DIAGNOSIS — I679 Cerebrovascular disease, unspecified: Secondary | ICD-10-CM

## 2011-09-14 DIAGNOSIS — I4891 Unspecified atrial fibrillation: Secondary | ICD-10-CM

## 2011-09-14 DIAGNOSIS — Z7901 Long term (current) use of anticoagulants: Secondary | ICD-10-CM

## 2011-09-14 DIAGNOSIS — I4892 Unspecified atrial flutter: Secondary | ICD-10-CM

## 2011-09-14 LAB — POCT INR: INR: 2.1

## 2011-09-14 MED ORDER — METOPROLOL SUCCINATE ER 50 MG PO TB24: 50.0000 mg | ORAL_TABLET | Freq: Every day | ORAL | Status: DC

## 2011-09-27 ENCOUNTER — Ambulatory Visit (INDEPENDENT_AMBULATORY_CARE_PROVIDER_SITE_OTHER): Payer: Medicare Other | Admitting: *Deleted

## 2011-09-27 DIAGNOSIS — I679 Cerebrovascular disease, unspecified: Secondary | ICD-10-CM

## 2011-09-27 DIAGNOSIS — I4891 Unspecified atrial fibrillation: Secondary | ICD-10-CM

## 2011-09-27 DIAGNOSIS — I4892 Unspecified atrial flutter: Secondary | ICD-10-CM

## 2011-09-27 DIAGNOSIS — Z7901 Long term (current) use of anticoagulants: Secondary | ICD-10-CM

## 2011-09-27 LAB — POCT INR: INR: 5.5

## 2011-10-02 ENCOUNTER — Other Ambulatory Visit: Payer: Self-pay | Admitting: Cardiology

## 2011-10-07 ENCOUNTER — Ambulatory Visit (INDEPENDENT_AMBULATORY_CARE_PROVIDER_SITE_OTHER): Payer: Medicare Other | Admitting: *Deleted

## 2011-10-07 DIAGNOSIS — I4891 Unspecified atrial fibrillation: Secondary | ICD-10-CM

## 2011-10-07 DIAGNOSIS — I4892 Unspecified atrial flutter: Secondary | ICD-10-CM

## 2011-10-07 DIAGNOSIS — Z7901 Long term (current) use of anticoagulants: Secondary | ICD-10-CM

## 2011-10-07 DIAGNOSIS — I679 Cerebrovascular disease, unspecified: Secondary | ICD-10-CM

## 2011-10-07 LAB — POCT INR: INR: 3

## 2011-10-22 ENCOUNTER — Ambulatory Visit (INDEPENDENT_AMBULATORY_CARE_PROVIDER_SITE_OTHER): Payer: Medicare Other | Admitting: *Deleted

## 2011-10-22 DIAGNOSIS — I679 Cerebrovascular disease, unspecified: Secondary | ICD-10-CM

## 2011-10-22 DIAGNOSIS — I4892 Unspecified atrial flutter: Secondary | ICD-10-CM

## 2011-10-22 DIAGNOSIS — I4891 Unspecified atrial fibrillation: Secondary | ICD-10-CM

## 2011-10-22 DIAGNOSIS — Z7901 Long term (current) use of anticoagulants: Secondary | ICD-10-CM

## 2011-10-22 LAB — POCT INR: INR: 2.6

## 2011-11-12 ENCOUNTER — Ambulatory Visit (INDEPENDENT_AMBULATORY_CARE_PROVIDER_SITE_OTHER): Payer: Medicare Other | Admitting: Pharmacist

## 2011-11-12 DIAGNOSIS — I679 Cerebrovascular disease, unspecified: Secondary | ICD-10-CM

## 2011-11-12 DIAGNOSIS — Z7901 Long term (current) use of anticoagulants: Secondary | ICD-10-CM

## 2011-11-12 DIAGNOSIS — I4891 Unspecified atrial fibrillation: Secondary | ICD-10-CM

## 2011-11-12 DIAGNOSIS — I4892 Unspecified atrial flutter: Secondary | ICD-10-CM

## 2011-11-12 LAB — POCT INR: INR: 4.6

## 2011-11-26 ENCOUNTER — Ambulatory Visit (INDEPENDENT_AMBULATORY_CARE_PROVIDER_SITE_OTHER): Payer: Medicare Other | Admitting: *Deleted

## 2011-11-26 DIAGNOSIS — Z7901 Long term (current) use of anticoagulants: Secondary | ICD-10-CM

## 2011-11-26 DIAGNOSIS — I4891 Unspecified atrial fibrillation: Secondary | ICD-10-CM

## 2011-11-26 DIAGNOSIS — I679 Cerebrovascular disease, unspecified: Secondary | ICD-10-CM

## 2011-11-26 DIAGNOSIS — I4892 Unspecified atrial flutter: Secondary | ICD-10-CM

## 2011-11-26 LAB — PROTIME-INR: INR: 4.53 — ABNORMAL HIGH (ref <1.50)

## 2011-11-29 ENCOUNTER — Telehealth: Payer: Self-pay | Admitting: Internal Medicine

## 2011-11-29 NOTE — Telephone Encounter (Signed)
Coumadin ck Monday, INR up , per rhonda pa after hours message, pt to hold coumadin Fri and Sat night take one tab Sunday, coumadin clinic to call pt and let her know when to come in, also question of dosage

## 2011-11-29 NOTE — Telephone Encounter (Signed)
Spoke with daughter, see anticoagulation note from 11/26/2011, f/u appt made

## 2011-12-03 ENCOUNTER — Ambulatory Visit (INDEPENDENT_AMBULATORY_CARE_PROVIDER_SITE_OTHER): Payer: Medicare Other | Admitting: *Deleted

## 2011-12-03 DIAGNOSIS — Z7901 Long term (current) use of anticoagulants: Secondary | ICD-10-CM

## 2011-12-03 DIAGNOSIS — I4891 Unspecified atrial fibrillation: Secondary | ICD-10-CM

## 2011-12-03 DIAGNOSIS — I4892 Unspecified atrial flutter: Secondary | ICD-10-CM

## 2011-12-03 DIAGNOSIS — I679 Cerebrovascular disease, unspecified: Secondary | ICD-10-CM

## 2011-12-13 ENCOUNTER — Ambulatory Visit (INDEPENDENT_AMBULATORY_CARE_PROVIDER_SITE_OTHER): Payer: Medicare Other | Admitting: *Deleted

## 2011-12-13 DIAGNOSIS — I4892 Unspecified atrial flutter: Secondary | ICD-10-CM

## 2011-12-13 DIAGNOSIS — I679 Cerebrovascular disease, unspecified: Secondary | ICD-10-CM

## 2011-12-13 DIAGNOSIS — I4891 Unspecified atrial fibrillation: Secondary | ICD-10-CM

## 2011-12-13 DIAGNOSIS — Z7901 Long term (current) use of anticoagulants: Secondary | ICD-10-CM

## 2011-12-13 LAB — PROTIME-INR
INR: 4.62 — ABNORMAL HIGH (ref <1.50)
Prothrombin Time: 41 seconds — ABNORMAL HIGH (ref 11.6–15.2)

## 2011-12-14 ENCOUNTER — Ambulatory Visit: Payer: Self-pay | Admitting: Pharmacist

## 2011-12-14 DIAGNOSIS — Z7901 Long term (current) use of anticoagulants: Secondary | ICD-10-CM

## 2011-12-14 DIAGNOSIS — I4892 Unspecified atrial flutter: Secondary | ICD-10-CM

## 2011-12-14 DIAGNOSIS — I679 Cerebrovascular disease, unspecified: Secondary | ICD-10-CM

## 2011-12-14 DIAGNOSIS — I4891 Unspecified atrial fibrillation: Secondary | ICD-10-CM

## 2012-01-28 ENCOUNTER — Encounter: Payer: Self-pay | Admitting: Internal Medicine

## 2012-01-28 ENCOUNTER — Ambulatory Visit (INDEPENDENT_AMBULATORY_CARE_PROVIDER_SITE_OTHER): Payer: Medicare Other | Admitting: Internal Medicine

## 2012-01-28 VITALS — BP 94/62 | HR 82 | Ht 65.0 in | Wt 116.0 lb

## 2012-01-28 DIAGNOSIS — Z7901 Long term (current) use of anticoagulants: Secondary | ICD-10-CM

## 2012-01-28 DIAGNOSIS — I495 Sick sinus syndrome: Secondary | ICD-10-CM

## 2012-01-28 DIAGNOSIS — I10 Essential (primary) hypertension: Secondary | ICD-10-CM

## 2012-01-28 DIAGNOSIS — I4891 Unspecified atrial fibrillation: Secondary | ICD-10-CM

## 2012-01-28 DIAGNOSIS — Z950 Presence of cardiac pacemaker: Secondary | ICD-10-CM

## 2012-01-28 LAB — PACEMAKER DEVICE OBSERVATION
BMOD-0003RV: 30
BRDY-0002RV: 40 {beats}/min
RV LEAD AMPLITUDE: 11.2 mv
VENTRICULAR PACING PM: 0.2

## 2012-01-28 MED ORDER — METOPROLOL SUCCINATE ER 50 MG PO TB24: ORAL_TABLET | ORAL | Status: DC

## 2012-01-28 NOTE — Patient Instructions (Addendum)
Your physician wants you to follow-up in: 6 months with Dr Jens Som and 12 months with Dr Jacquiline Doe will receive a reminder letter in the mail two months in advance. If you don't receive a letter, please call our office to schedule the follow-up appointment.   Your physician has recommended you make the following change in your medication:  1) Stop Nifedipine(Procardia) 2) Increase Toprol(Metoprolol) to 75mg  daily 1 1/2 tablets daily  Call Dr Jens Som if BP goes above 140/90

## 2012-01-28 NOTE — Progress Notes (Signed)
PCP: Verl Bangs, MD Primary Cardiologist:  Dr Guadalupe Dawn is a 78 y.o. female who presents today for routine electrophysiology followup.  Since last being seen in our clinic, the patient reports doing very well.  Today, she denies symptoms of palpitations, chest pain, shortness of breath,  lower extremity edema, dizziness, presyncope, or syncope.  The patient is otherwise without complaint today.   Past Medical History  Diagnosis Date  . CAD (coronary artery disease)   . HLD (hyperlipidemia)   . HTN (hypertension)   . CVD (cerebrovascular disease)   . Atrial fibrillation   . Renal insufficiency   . Tachycardia-bradycardia syndrome    Past Surgical History  Procedure Date  . Pacemaker insertion     medtronic by JA for tachy/brady syndrome  . Coronary artery bypass graft     Current Outpatient Prescriptions  Medication Sig Dispense Refill  . Ascorbic Acid (VITAMIN C) 500 MG tablet Take 500 mg by mouth daily.        Marland Kitchen atorvastatin (LIPITOR) 80 MG tablet take 1 tablet by mouth at bedtime  30 tablet  11  . docusate sodium (COLACE) 100 MG capsule Take 100 mg by mouth 2 (two) times daily.        . ergocalciferol (VITAMIN D2) 50000 UNITS capsule Take 50,000 Units by mouth once a week.        . ferrous sulfate 325 (65 FE) MG tablet Take 325 mg by mouth daily with breakfast.        . levothyroxine (SYNTHROID, LEVOTHROID) 100 MCG tablet Take 100 mcg by mouth as directed. Take 6 days/week, skip on Sundays      . metoprolol succinate (TOPROL-XL) 50 MG 24 hr tablet Take 1 tablet (50 mg total) by mouth daily. Take with or immediately following a meal.  30 tablet  6  . Multiple Vitamin (MULTIVITAMIN) tablet Take 1 tablet by mouth daily.        . Multiple Vitamins-Minerals (ICAPS MV PO) Take 1 capsule by mouth daily.        Marland Kitchen NIFEdipine (PROCARDIA XL/ADALAT-CC) 60 MG 24 hr tablet Take 60 mg by mouth daily.        . risedronate (ACTONEL) 35 MG tablet Take 35 mg by mouth every  30 (thirty) days. with water on empty stomach, nothing by mouth or lie down for next 30 minutes.       . triamterene-hydrochlorothiazide (DYAZIDE) 37.5-25 MG per capsule Take 1 capsule by mouth daily.      Marland Kitchen warfarin (COUMADIN) 1 MG tablet Take as directed by Anticoagulation clinic  50 tablet  2    Physical Exam: Filed Vitals:   01/28/12 0907  BP: 94/62  Pulse: 82  Height: 5\' 5"  (1.651 m)  Weight: 116 lb (52.617 kg)  SpO2: 97%    GEN- The patient is well appearing, alert and oriented x 3 today.   Head- normocephalic, atraumatic Eyes-  Sclera clear, conjunctiva pink Ears- hearing intact Oropharynx- clear Lungs- Clear to ausculation bilaterally, normal work of breathing Chest- pacemaker pocket is well healed Heart- irregular rate and rhythm, no murmurs, rubs or gallops, PMI not laterally displaced GI- soft, NT, ND, + BS Extremities- no clubbing, cyanosis, or edema  Pacemaker interrogation- reviewed in detail today,  See PACEART report  Assessment and Plan:  Tachycardia-bradycardia syndrome  Normal pacemaker function  See Pace Art report  No changes today  V rates are above 100 bpm >30% of the time, will increase toprol to 75mg  daily  today  ATRIAL FIBRILLATION  INRs are very erratic and frequently very high.  I spoke with patient and daughter at length regarding my concerns.  I think that she would be better served with eliquis 2.5mg  daily or renally adjusted xarelto.  I would not use pradaxa given trend towards increased bleeding in similar patients.  At this time, they are very clear that they are uncomfortable with the novel agents and would prefer to continue coumadin.  I have encouraged her to discuss this further with Dr Jens Som going forward.  HTN BP is low today Stop nifedipine Increase toprol for rate control She will contact us if BP becomes elevated off of nifedipine  Return to the device clinic in 1year

## 2012-04-21 ENCOUNTER — Other Ambulatory Visit: Payer: Self-pay | Admitting: Cardiology

## 2012-09-01 ENCOUNTER — Other Ambulatory Visit: Payer: Self-pay | Admitting: Internal Medicine

## 2012-09-05 ENCOUNTER — Encounter: Payer: Self-pay | Admitting: *Deleted

## 2012-09-06 ENCOUNTER — Ambulatory Visit: Payer: Medicare Other | Admitting: Cardiology

## 2012-12-09 DEATH — deceased

## 2013-02-15 ENCOUNTER — Encounter: Payer: Self-pay | Admitting: *Deleted

## 2013-07-09 ENCOUNTER — Encounter: Payer: Self-pay | Admitting: Cardiology

## 2013-07-10 ENCOUNTER — Telehealth: Payer: Self-pay | Admitting: Internal Medicine

## 2013-07-10 NOTE — Telephone Encounter (Addendum)
07-09-13 sent past due device check letter certified/mt CERTIFIED LETTER SIGNED BY PATIENT'S DAUGHTER SUSAN WILLIS 07-14-13/MT
